# Patient Record
Sex: Male | Born: 1955 | Race: White | Hispanic: No | Marital: Married | Smoking: Former smoker
Health system: Southern US, Community
[De-identification: ages and names within clinical notes are randomized; demographics above are authoritative.]

## PROBLEM LIST (undated history)

## (undated) DIAGNOSIS — E785 Hyperlipidemia, unspecified: Secondary | ICD-10-CM

## (undated) DIAGNOSIS — F419 Anxiety disorder, unspecified: Secondary | ICD-10-CM

## (undated) DIAGNOSIS — R002 Palpitations: Secondary | ICD-10-CM

## (undated) DIAGNOSIS — Z87891 Personal history of nicotine dependence: Secondary | ICD-10-CM

## (undated) DIAGNOSIS — E039 Hypothyroidism, unspecified: Secondary | ICD-10-CM

## (undated) DIAGNOSIS — I10 Essential (primary) hypertension: Secondary | ICD-10-CM

## (undated) DIAGNOSIS — J439 Emphysema, unspecified: Secondary | ICD-10-CM

## (undated) DIAGNOSIS — I701 Atherosclerosis of renal artery: Secondary | ICD-10-CM

## (undated) DIAGNOSIS — M1990 Unspecified osteoarthritis, unspecified site: Secondary | ICD-10-CM

## (undated) DIAGNOSIS — E079 Disorder of thyroid, unspecified: Secondary | ICD-10-CM

## (undated) DIAGNOSIS — H269 Unspecified cataract: Secondary | ICD-10-CM

## (undated) HISTORY — DX: Palpitations: R00.2

## (undated) HISTORY — DX: Hypothyroidism, unspecified: E03.9

## (undated) HISTORY — DX: Essential (primary) hypertension: I10

## (undated) HISTORY — DX: Emphysema, unspecified: J43.9

## (undated) HISTORY — DX: Hyperlipidemia, unspecified: E78.5

## (undated) HISTORY — PX: EYE SURGERY: SHX253

## (undated) HISTORY — DX: Unspecified cataract: H26.9

## (undated) HISTORY — DX: Atherosclerosis of renal artery: I70.1

## (undated) HISTORY — DX: Unspecified osteoarthritis, unspecified site: M19.90

## (undated) HISTORY — DX: Personal history of nicotine dependence: Z87.891

---

## 1998-08-14 ENCOUNTER — Emergency Department (HOSPITAL_COMMUNITY): Admission: EM | Admit: 1998-08-14 | Discharge: 1998-08-14 | Payer: Self-pay | Admitting: Emergency Medicine

## 2003-05-28 ENCOUNTER — Emergency Department (HOSPITAL_COMMUNITY): Admission: EM | Admit: 2003-05-28 | Discharge: 2003-05-28 | Payer: Self-pay | Admitting: Emergency Medicine

## 2003-11-17 ENCOUNTER — Ambulatory Visit (HOSPITAL_COMMUNITY): Admission: RE | Admit: 2003-11-17 | Discharge: 2003-11-17 | Payer: Self-pay | Admitting: Family Medicine

## 2007-12-30 ENCOUNTER — Ambulatory Visit (HOSPITAL_COMMUNITY): Admission: RE | Admit: 2007-12-30 | Discharge: 2007-12-30 | Payer: Self-pay | Admitting: Family Medicine

## 2010-11-21 ENCOUNTER — Ambulatory Visit (HOSPITAL_COMMUNITY)
Admission: RE | Admit: 2010-11-21 | Discharge: 2010-11-21 | Disposition: A | Discharge status: Home / Self Care | Payer: PRIVATE HEALTH INSURANCE | Source: Ambulatory Visit | Attending: Family Medicine | Admitting: Family Medicine

## 2010-11-21 ENCOUNTER — Other Ambulatory Visit (HOSPITAL_COMMUNITY): Payer: Self-pay | Admitting: Family Medicine

## 2010-11-21 ENCOUNTER — Encounter (HOSPITAL_COMMUNITY): Payer: Self-pay

## 2010-11-21 DIAGNOSIS — F17200 Nicotine dependence, unspecified, uncomplicated: Secondary | ICD-10-CM

## 2010-11-21 DIAGNOSIS — R1010 Upper abdominal pain, unspecified: Secondary | ICD-10-CM

## 2010-11-21 DIAGNOSIS — R109 Unspecified abdominal pain: Secondary | ICD-10-CM | POA: Insufficient documentation

## 2010-11-21 DIAGNOSIS — R0789 Other chest pain: Secondary | ICD-10-CM

## 2010-11-21 DIAGNOSIS — R05 Cough: Secondary | ICD-10-CM | POA: Insufficient documentation

## 2010-11-21 DIAGNOSIS — R059 Cough, unspecified: Secondary | ICD-10-CM | POA: Insufficient documentation

## 2011-01-09 ENCOUNTER — Ambulatory Visit (INDEPENDENT_AMBULATORY_CARE_PROVIDER_SITE_OTHER): Payer: PRIVATE HEALTH INSURANCE | Admitting: Internal Medicine

## 2011-01-15 ENCOUNTER — Ambulatory Visit (INDEPENDENT_AMBULATORY_CARE_PROVIDER_SITE_OTHER): Payer: PRIVATE HEALTH INSURANCE | Admitting: Internal Medicine

## 2011-01-15 DIAGNOSIS — K921 Melena: Secondary | ICD-10-CM

## 2011-01-30 NOTE — Consult Note (Addendum)
[name], [name] NO.:  [id number]  MEDICAL RECORD NO.:  [id number]           PATIENT TYPE:  LOCATION:                                 FACILITY:  PHYSICIAN:  Lionel December, M.D.    DATE OF BIRTH:  07-13-1956  DATE OF CONSULTATION: DATE OF DISCHARGE:                                CONSULTATION   PRESENTING COMPLAINT:  Recent bout with diarrhea and hematochezia.  HISTORY OF PRESENT ILLNESS:  Daniel [name] is a 55 year old Caucasian male who is referred through courtesy of Dr. Phillips Odor, for GI evaluation.  He was in usual state of health until about 8 weeks ago, he developed bout with diarrhea and hematochezia.  He was treated with antibiotics with resolution of his diarrhea and he has not experienced any more bleeding. He saw Dr. Phillips Odor.  The patient's last colonoscopy has been over 10 years ago and he felt he should get this exam done to make sure he does not have polyps or neoplasm.  He is having normal stools now.  He says his appetite is very good.  He has not lost any weight recently.  He does complain of intermittent pain in his right mid abdomen which he has experienced some mornings when he wakes up.  He denies injury to his back.  His pain is not associated with nausea, vomiting, dysuria, hematuria, or bowel urgency.  REVIEW OF THE SYSTEMS:  Negative for heartburn, dysphagia.  CURRENT MEDICATIONS: 1. Citalopram 10 mg p.o. daily. 2. Dexilant 60 mg p.o. q.a.m. 3. Levothyroxine 125 mcg p.o. daily. 4. Motrin OTC 1-2 daily p.r.n. which is occasional.  PAST MEDICAL HISTORY:  History of stress disorder and he is doing well with therapy.  Hypothyroidism was diagnosed about 8 years ago.  History of GERD. Symptoms well-controlled with therapy, he has been on it for about 6 months or so.  He was diagnosed with COPD about 2 months ago based on chest pain.  He states apparently he had similar findings on chest x-ray 3 years ago, but did not know the result.   This diagnosis has led him to quit cigarette smoking.  His last colonoscopy was over 10 years ago by me and was normal.  FAMILY HISTORY:  Mother is 28 and has diabetes and osteoarthrosis. Father died at age 36 of mesothelioma within 6 weeks of diagnosis.  He has a sister age 5 in good health.  He has a paternal aunt who is in her 20's and has Crohn disease.  SOCIAL HISTORY:  He is married.  He has one son in good health.  He has two adopted children, one stepchild also in good health.  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  He smoked about a pack to pack and half per day for 40 years, but quit 7 weeks ago.  He drinks beer no more than 4-7 drinks per week.  OBJECTIVE:  VITAL SIGNS:  Weight 188 pounds, he is 71 inches tall, pulse 74 per minute, blood pressure 130/78, and temp is 97.8. HEENT:  Conjunctivae is pink.  Sclerae nonicteric.  Oropharyngeal mucosa is normal.  No  neck masses or thyromegaly noted.  CARDIAC:  With regular rhythm.  Normal S1 and S2.  No murmur or gallop noted. LUNGS:  Clear to auscultation. ABDOMEN:  Symmetrical.  Bowel sounds are normal.  On palpation soft abdomen without tenderness, organomegaly, or masses.  RECTAL:  Deferred. EXTREMITIES:  No peripheral edema or clubbing noted.  ASSESSMENT:  Daniel [name] is 55 year old Caucasian male who experienced bout with diarrhea and hematochezia several weeks ago and responded to antibiotic therapy.  He is not having intermittent right mid abdominal pain, first experiences when he wakes up in the morning, intermittent and not associated with other symptoms.  Suspect this may be a musculoskeletal pain.  I agree this colon needs to be survey primarily for screening purposes.  History of GERD.  He appears to be doing well with therapy.  After few months, he may consider taking Dexilant on every other day basis.  RECOMMENDATIONS:  Colonoscopy to be performed at [facility] in near future.  I have reviewed the procedures  with Daniel [name] and he is agreeable.  We appreciate the opportunity to participate in the care of this gentleman.          ______________________________ Lionel December, M.D.     NR/MEDQ  D:  01/15/2011  T:  01/16/2011  Job:  [id number]  cc:   Dr. Phillips Odor  Electronically Signed by Lionel December M.D. on 01/30/2011 10:20:50 AM

## 2011-02-22 ENCOUNTER — Encounter (INDEPENDENT_AMBULATORY_CARE_PROVIDER_SITE_OTHER): Payer: PRIVATE HEALTH INSURANCE | Admitting: Internal Medicine

## 2011-02-22 ENCOUNTER — Ambulatory Visit (HOSPITAL_COMMUNITY)
Admission: RE | Admit: 2011-02-22 | Payer: PRIVATE HEALTH INSURANCE | Source: Ambulatory Visit | Admitting: Internal Medicine

## 2012-04-28 ENCOUNTER — Ambulatory Visit (HOSPITAL_COMMUNITY)
Admission: RE | Admit: 2012-04-28 | Discharge: 2012-04-28 | Disposition: A | Discharge status: Home / Self Care | Payer: PRIVATE HEALTH INSURANCE | Source: Ambulatory Visit | Attending: Family Medicine | Admitting: Family Medicine

## 2012-04-28 ENCOUNTER — Other Ambulatory Visit (HOSPITAL_COMMUNITY): Payer: Self-pay | Admitting: Family Medicine

## 2012-04-28 DIAGNOSIS — M25569 Pain in unspecified knee: Secondary | ICD-10-CM

## 2012-04-28 DIAGNOSIS — IMO0002 Reserved for concepts with insufficient information to code with codable children: Secondary | ICD-10-CM

## 2012-04-28 DIAGNOSIS — M25469 Effusion, unspecified knee: Secondary | ICD-10-CM | POA: Insufficient documentation

## 2012-07-18 HISTORY — PX: US ECHOCARDIOGRAPHY: HXRAD669

## 2012-10-06 ENCOUNTER — Encounter (INDEPENDENT_AMBULATORY_CARE_PROVIDER_SITE_OTHER): Payer: Self-pay | Admitting: *Deleted

## 2012-11-25 ENCOUNTER — Ambulatory Visit (INDEPENDENT_AMBULATORY_CARE_PROVIDER_SITE_OTHER): Payer: BC Managed Care – PPO | Admitting: Urology

## 2012-11-25 DIAGNOSIS — E291 Testicular hypofunction: Secondary | ICD-10-CM

## 2012-11-25 DIAGNOSIS — R972 Elevated prostate specific antigen [PSA]: Secondary | ICD-10-CM

## 2012-11-25 DIAGNOSIS — N529 Male erectile dysfunction, unspecified: Secondary | ICD-10-CM

## 2012-11-27 ENCOUNTER — Other Ambulatory Visit (HOSPITAL_COMMUNITY): Payer: Self-pay | Admitting: Physician Assistant

## 2012-11-27 DIAGNOSIS — M25562 Pain in left knee: Secondary | ICD-10-CM

## 2012-11-27 DIAGNOSIS — M7122 Synovial cyst of popliteal space [Baker], left knee: Secondary | ICD-10-CM

## 2012-11-28 ENCOUNTER — Ambulatory Visit (HOSPITAL_COMMUNITY)
Admission: RE | Admit: 2012-11-28 | Discharge: 2012-11-28 | Disposition: A | Discharge status: Home / Self Care | Payer: BC Managed Care – PPO | Source: Ambulatory Visit | Attending: Physician Assistant | Admitting: Physician Assistant

## 2012-11-28 DIAGNOSIS — M7122 Synovial cyst of popliteal space [Baker], left knee: Secondary | ICD-10-CM

## 2012-11-28 DIAGNOSIS — M79609 Pain in unspecified limb: Secondary | ICD-10-CM | POA: Insufficient documentation

## 2012-11-28 DIAGNOSIS — M7120 Synovial cyst of popliteal space [Baker], unspecified knee: Secondary | ICD-10-CM | POA: Insufficient documentation

## 2012-11-28 DIAGNOSIS — I82890 Acute embolism and thrombosis of other specified veins: Secondary | ICD-10-CM | POA: Insufficient documentation

## 2012-11-28 DIAGNOSIS — M25562 Pain in left knee: Secondary | ICD-10-CM

## 2013-01-21 ENCOUNTER — Ambulatory Visit: Payer: Self-pay | Admitting: General Practice

## 2013-03-27 ENCOUNTER — Ambulatory Visit (INDEPENDENT_AMBULATORY_CARE_PROVIDER_SITE_OTHER): Payer: BC Managed Care – PPO | Admitting: Urology

## 2013-03-27 DIAGNOSIS — N529 Male erectile dysfunction, unspecified: Secondary | ICD-10-CM

## 2013-03-27 DIAGNOSIS — E291 Testicular hypofunction: Secondary | ICD-10-CM

## 2013-05-29 ENCOUNTER — Ambulatory Visit: Payer: Self-pay | Admitting: General Practice

## 2013-05-29 HISTORY — PX: MENISCUS REPAIR: SHX5179

## 2013-07-07 ENCOUNTER — Other Ambulatory Visit: Payer: Self-pay | Admitting: *Deleted

## 2013-07-07 MED ORDER — NEBIVOLOL HCL 5 MG PO TABS: 2.5000 mg | ORAL_TABLET | Freq: Every day | ORAL | Status: DC

## 2013-07-07 NOTE — Telephone Encounter (Signed)
Rx was sent to pharmacy electronically. 

## 2013-07-17 ENCOUNTER — Ambulatory Visit (INDEPENDENT_AMBULATORY_CARE_PROVIDER_SITE_OTHER): Payer: BC Managed Care – PPO | Admitting: Cardiovascular Disease

## 2013-07-17 ENCOUNTER — Encounter: Payer: Self-pay | Admitting: Cardiovascular Disease

## 2013-07-17 VITALS — BP 130/80 | HR 65 | Ht 71.0 in | Wt 198.0 lb

## 2013-07-17 DIAGNOSIS — Z79899 Other long term (current) drug therapy: Secondary | ICD-10-CM

## 2013-07-17 DIAGNOSIS — I10 Essential (primary) hypertension: Secondary | ICD-10-CM | POA: Insufficient documentation

## 2013-07-17 DIAGNOSIS — E785 Hyperlipidemia, unspecified: Secondary | ICD-10-CM | POA: Insufficient documentation

## 2013-07-17 NOTE — Assessment & Plan Note (Signed)
Intolerant to statin therapy. We will recheck a lipid and liver profile

## 2013-07-17 NOTE — Progress Notes (Signed)
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 He was previously a patient of Dr. Susa Griffins. I am assuming his care. I apparently took care of his father prior to his death. His cardiac risk factor profile is positive for 80 pack years of tobacco abuse having quit 25 years ago, treated hypertension and mild hyperlipidemia. He has never had a heart for stroke. He denies chest pain or shortness of breath. He had a negative Myoview stress test 07/18/12 a normal 2-D echo at that time.   Current Outpatient Prescriptions  Medication Sig Dispense Refill  . aspirin 81 MG tablet Take 81 mg by mouth daily.      Marland Kitchen escitalopram (LEXAPRO) 10 MG tablet Take 10 mg by mouth at bedtime.       Marland Kitchen levothyroxine (SYNTHROID, LEVOTHROID) 125 MCG tablet Take 125 mcg by mouth daily before breakfast.       . losartan-hydrochlorothiazide (HYZAAR) 50-12.5 MG per tablet Take by mouth. Takes 1/2 tablet daily      . nebivolol (BYSTOLIC) 5 MG tablet Take 0.5 tablets (2.5 mg total) by mouth daily.  15 tablet  8  . tadalafil (CIALIS) 10 MG tablet Take 10 mg by mouth as needed for erectile dysfunction.       No current facility-administered medications for this visit.    No Known Allergies  History   Social History  . Marital Status: Married    Spouse Name: N/A    Number of Children: N/A  . Years of Education: N/A   Occupational History  . Not on file.   Social History Main Topics  . Smoking status: Former Smoker -- 2.00 packs/day for 40 years    Types: Cigarettes    Quit date: 12/16/2010  . Smokeless tobacco: Not on file  . Alcohol Use: 14.0 oz/week    28 drink(s) per week     Comment: Beer  . Drug Use: No  . Sexual Activity:  Not on file   Other Topics Concern  . Not on file   Social History Narrative  . No narrative on file     Review of Systems: General: negative for chills, fever, night sweats or weight changes.  Cardiovascular: negative for chest pain, dyspnea on exertion, edema, orthopnea, palpitations, paroxysmal nocturnal dyspnea or shortness of breath Dermatological: negative for rash Respiratory: negative for cough or wheezing Urologic: negative for hematuria Abdominal: negative for nausea, vomiting, diarrhea, bright red blood per rectum, melena, or hematemesis Neurologic: negative for visual changes, syncope, or dizziness All other systems reviewed and are otherwise negative except as noted above.    Blood pressure 130/80, pulse 65, height 5\' 11"  (1.803 m), weight 198 lb (89.812 kg).  General appearance: alert and no distress Neck: no adenopathy, no carotid bruit, no JVD, supple, symmetrical, trachea midline and thyroid not enlarged, symmetric, no tenderness/mass/nodules Lungs: clear to auscultation bilaterally Heart: regular rate and rhythm, S1, S2 normal, no murmur, click, rub or gallop Abdomen: soft, non-tender; bowel sounds normal; no masses,  no organomegaly Extremities: extremities normal, atraumatic, no cyanosis or edema and 2+ pedal pulses  EKG sinus rhythm at 65 without ST or T wave changes  ASSESSMENT AND PLAN:   Hypertension Well-controlled on current medications  Hyperlipidemia Intolerant to  statin therapy. We will recheck a lipid and liver profile      Runell Gess MD [facility] 07/17/2013 12:53 PM

## 2013-07-17 NOTE — Patient Instructions (Signed)
Dr Allyson Sabal has ordered blood work to be done fasting.    Dr Allyson Sabal will see you back in 1 year.

## 2013-07-17 NOTE — Assessment & Plan Note (Signed)
Well-controlled on current medications 

## 2013-10-15 ENCOUNTER — Telehealth (HOSPITAL_COMMUNITY): Payer: Self-pay | Admitting: *Deleted

## 2013-10-20 ENCOUNTER — Other Ambulatory Visit (HOSPITAL_COMMUNITY): Payer: Self-pay | Admitting: Cardiovascular Disease

## 2013-10-20 DIAGNOSIS — I701 Atherosclerosis of renal artery: Secondary | ICD-10-CM

## 2013-10-29 ENCOUNTER — Ambulatory Visit (HOSPITAL_COMMUNITY)
Admission: RE | Admit: 2013-10-29 | Discharge: 2013-10-29 | Disposition: A | Discharge status: Home / Self Care | Payer: BC Managed Care – PPO | Source: Ambulatory Visit | Attending: Internal Medicine | Admitting: Internal Medicine

## 2013-10-29 DIAGNOSIS — I701 Atherosclerosis of renal artery: Secondary | ICD-10-CM

## 2013-10-29 NOTE — Progress Notes (Signed)
Renal Artery Duplex Completed. °Brianna L Mazza,RVT °

## 2013-11-15 ENCOUNTER — Encounter: Payer: Self-pay | Admitting: *Deleted

## 2013-11-15 ENCOUNTER — Telehealth: Payer: Self-pay | Admitting: *Deleted

## 2013-11-15 DIAGNOSIS — I701 Atherosclerosis of renal artery: Secondary | ICD-10-CM

## 2013-11-15 NOTE — Telephone Encounter (Signed)
Order placed for repeat renal dopplers in 6 months

## 2013-11-15 NOTE — Telephone Encounter (Signed)
Message copied by Chauncy Lean on Sun Nov 15, 2013 10:32 PM ------      Message from: Lorretta Harp      Created: Sat Nov 14, 2013 11:16 AM       No change from prior study. Repeat in 6 months ------

## 2014-02-05 ENCOUNTER — Other Ambulatory Visit: Payer: Self-pay

## 2014-02-05 MED ORDER — LOSARTAN POTASSIUM-HCTZ 50-12.5 MG PO TABS: 0.5000 | ORAL_TABLET | Freq: Every day | ORAL | Status: DC

## 2014-02-05 NOTE — Telephone Encounter (Signed)
Rx was sent to pharmacy electronically. 

## 2014-02-11 ENCOUNTER — Telehealth: Payer: Self-pay | Admitting: Cardiovascular Disease

## 2014-02-11 NOTE — Telephone Encounter (Signed)
RN returned call to pharmacy to clarify refill for losartan-hctz. Per last OV noted in October 2014, patient was taking 1/2 tablet QD. Pharmacy will confirm with patient when they pick up script.

## 2014-02-11 NOTE — Telephone Encounter (Signed)
Daniel [name] with [facility] has a question about an Escript that he received for this patient for Losartin.

## 2014-04-19 ENCOUNTER — Other Ambulatory Visit (HOSPITAL_COMMUNITY): Payer: Self-pay | Admitting: Cardiovascular Disease

## 2014-04-19 NOTE — Telephone Encounter (Signed)
Rx was sent to pharmacy electronically. 

## 2014-06-03 ENCOUNTER — Ambulatory Visit (HOSPITAL_COMMUNITY)
Admission: RE | Admit: 2014-06-03 | Discharge: 2014-06-03 | Disposition: A | Discharge status: Home / Self Care | Payer: BC Managed Care – PPO | Source: Ambulatory Visit | Attending: Internal Medicine | Admitting: Internal Medicine

## 2014-06-03 DIAGNOSIS — I701 Atherosclerosis of renal artery: Secondary | ICD-10-CM | POA: Diagnosis not present

## 2014-06-03 NOTE — Progress Notes (Signed)
Renal Artery Duplex Completed. °Brianna L Mazza,RVT °

## 2014-06-15 ENCOUNTER — Encounter (INDEPENDENT_AMBULATORY_CARE_PROVIDER_SITE_OTHER): Payer: Self-pay | Admitting: *Deleted

## 2014-06-16 NOTE — Progress Notes (Signed)
I spoke with Dr Gwenlyn Found. We will repeat the test in 6 months

## 2014-07-21 ENCOUNTER — Other Ambulatory Visit (HOSPITAL_COMMUNITY): Payer: Self-pay | Admitting: Cardiovascular Disease

## 2014-07-21 NOTE — Telephone Encounter (Signed)
Rx was sent to pharmacy electronically. 

## 2014-08-10 ENCOUNTER — Other Ambulatory Visit (HOSPITAL_COMMUNITY): Payer: Self-pay | Admitting: Cardiovascular Disease

## 2014-08-10 NOTE — Telephone Encounter (Signed)
Rx was sent to pharmacy electronically. 

## 2014-08-24 ENCOUNTER — Other Ambulatory Visit (HOSPITAL_COMMUNITY): Payer: Self-pay | Admitting: Cardiovascular Disease

## 2014-08-24 NOTE — Telephone Encounter (Signed)
Rx was sent to pharmacy electronically. 

## 2014-09-13 ENCOUNTER — Other Ambulatory Visit (HOSPITAL_COMMUNITY): Payer: Self-pay | Admitting: Cardiovascular Disease

## 2014-09-13 NOTE — Telephone Encounter (Signed)
Rx(s) sent to pharmacy electronically. OV 10/26/14

## 2014-09-23 ENCOUNTER — Other Ambulatory Visit (HOSPITAL_COMMUNITY): Payer: Self-pay | Admitting: Cardiovascular Disease

## 2014-09-23 NOTE — Telephone Encounter (Signed)
Rx(s) sent to pharmacy electronically. OV 10/26/14

## 2014-09-28 ENCOUNTER — Telehealth: Payer: Self-pay | Admitting: Cardiovascular Disease

## 2014-09-28 NOTE — Telephone Encounter (Signed)
Need prior authorization for his Bystolic 5 mg please.

## 2014-09-28 NOTE — Telephone Encounter (Signed)
Pharmacy sent PA via covermymeds PA is thru [facility] # [phone] Patient last OV 06/2013 Needs Prior Auth for [place]  Routed to [place], Therapist, sports

## 2014-09-29 NOTE — Telephone Encounter (Signed)
Please complete PA

## 2014-10-01 NOTE — Telephone Encounter (Signed)
PA completed.  Awaiting results

## 2014-10-07 NOTE — Telephone Encounter (Signed)
Pt hasn't been seen in > 1 year.  Would suggest d/c Bystolic (only on 2.5 mg daily anyway) and bring patient back for MD appt.  Can then increase losartan/hct if his BP is elevated.  Was WNL at 2014 visit with Dr. Gwenlyn Found.

## 2014-10-07 NOTE — Telephone Encounter (Signed)
The PA was denied.  He has never tried any other beta blockers.  He was put on bystolic on 22/48/2500 by Dr Rollene Fare for hypertension.  The insurance will cover atenolol, carvedilol, and metoprolol.  Can we switch to another med?

## 2014-10-12 ENCOUNTER — Ambulatory Visit (INDEPENDENT_AMBULATORY_CARE_PROVIDER_SITE_OTHER): Payer: BLUE CROSS/BLUE SHIELD | Admitting: Urology

## 2014-10-12 DIAGNOSIS — E291 Testicular hypofunction: Secondary | ICD-10-CM

## 2014-10-12 DIAGNOSIS — R972 Elevated prostate specific antigen [PSA]: Secondary | ICD-10-CM

## 2014-10-12 DIAGNOSIS — N5201 Erectile dysfunction due to arterial insufficiency: Secondary | ICD-10-CM

## 2014-10-14 ENCOUNTER — Telehealth: Payer: Self-pay | Admitting: Cardiovascular Disease

## 2014-10-14 NOTE — Telephone Encounter (Signed)
Received records from [facility] (Dr Franchot Gallo) for appointment on 10/26/14 with Dr Gwenlyn Found.  Records given to [facility] (medical records) for Dr Kennon Holter schedule on 10/26/14.  lp

## 2014-10-24 ENCOUNTER — Emergency Department (HOSPITAL_COMMUNITY)
Admission: EM | Admit: 2014-10-24 | Discharge: 2014-10-25 | Disposition: A | Discharge status: Home / Self Care | Payer: BLUE CROSS/BLUE SHIELD | Attending: Emergency Medicine | Admitting: Emergency Medicine

## 2014-10-24 ENCOUNTER — Encounter (HOSPITAL_COMMUNITY): Payer: Self-pay

## 2014-10-24 DIAGNOSIS — R002 Palpitations: Secondary | ICD-10-CM | POA: Diagnosis present

## 2014-10-24 DIAGNOSIS — I10 Essential (primary) hypertension: Secondary | ICD-10-CM | POA: Insufficient documentation

## 2014-10-24 DIAGNOSIS — E785 Hyperlipidemia, unspecified: Secondary | ICD-10-CM | POA: Diagnosis not present

## 2014-10-24 DIAGNOSIS — I493 Ventricular premature depolarization: Secondary | ICD-10-CM | POA: Insufficient documentation

## 2014-10-24 DIAGNOSIS — Z87891 Personal history of nicotine dependence: Secondary | ICD-10-CM | POA: Insufficient documentation

## 2014-10-24 DIAGNOSIS — E079 Disorder of thyroid, unspecified: Secondary | ICD-10-CM | POA: Insufficient documentation

## 2014-10-24 DIAGNOSIS — F419 Anxiety disorder, unspecified: Secondary | ICD-10-CM | POA: Diagnosis not present

## 2014-10-24 HISTORY — DX: Anxiety disorder, unspecified: F41.9

## 2014-10-24 HISTORY — DX: Disorder of thyroid, unspecified: E07.9

## 2014-10-24 LAB — CBC WITH DIFFERENTIAL/PLATELET
Basophils Absolute: 0 10*3/uL (ref 0.0–0.1)
Basophils Relative: 0 % (ref 0–1)
Eosinophils Absolute: 0 10*3/uL (ref 0.0–0.7)
Eosinophils Relative: 0 % (ref 0–5)
HCT: 36.2 % — ABNORMAL LOW (ref 39.0–52.0)
Hemoglobin: 12.9 g/dL — ABNORMAL LOW (ref 13.0–17.0)
Lymphocytes Relative: 12 % (ref 12–46)
Lymphs Abs: 1.2 10*3/uL (ref 0.7–4.0)
MCH: 32.3 pg (ref 26.0–34.0)
MCHC: 35.6 g/dL (ref 30.0–36.0)
MCV: 90.5 fL (ref 78.0–100.0)
Monocytes Absolute: 0.9 10*3/uL (ref 0.1–1.0)
Monocytes Relative: 9 % (ref 3–12)
Neutro Abs: 7.9 10*3/uL — ABNORMAL HIGH (ref 1.7–7.7)
Neutrophils Relative %: 79 % — ABNORMAL HIGH (ref 43–77)
Platelets: 195 10*3/uL (ref 150–400)
RBC: 4 MIL/uL — ABNORMAL LOW (ref 4.22–5.81)
RDW: 12.2 % (ref 11.5–15.5)
WBC: 10 10*3/uL (ref 4.0–10.5)

## 2014-10-24 NOTE — ED Notes (Signed)
Patient states he began to have palpitations about a month ago. Patient states the palpitations come and go.

## 2014-10-25 LAB — BASIC METABOLIC PANEL
Anion gap: 7 (ref 5–15)
BUN: 15 mg/dL (ref 6–23)
CO2: 27 mmol/L (ref 19–32)
Calcium: 9 mg/dL (ref 8.4–10.5)
Chloride: 104 mmol/L (ref 96–112)
Creatinine, Ser: 0.9 mg/dL (ref 0.50–1.35)
GFR calc Af Amer: >90 mL/min (ref >90)
GFR calc non Af Amer: >90 mL/min (ref >90)
Glucose, Bld: 106 mg/dL — ABNORMAL HIGH (ref 70–99)
Potassium: 3.5 mmol/L (ref 3.5–5.1)
Sodium: 138 mmol/L (ref 135–145)

## 2014-10-25 LAB — TROPONIN I: Troponin I: <0.03 ng/mL (ref <0.031)

## 2014-10-25 LAB — MAGNESIUM: Magnesium: 1.9 mg/dL (ref 1.5–2.5)

## 2014-10-25 MED ORDER — AZITHROMYCIN 250 MG PO TABS: 250.0000 mg | ORAL_TABLET | Freq: Every day | ORAL | Status: DC

## 2014-10-25 NOTE — Discharge Instructions (Signed)
Palpitations A palpitation is the feeling that your heartbeat is irregular or is faster than normal. It may feel like your heart is fluttering or skipping a beat. Palpitations are usually not a serious problem. However, in some cases, you may need further medical evaluation. CAUSES  Palpitations can be caused by:  Smoking.  Caffeine or other stimulants, such as diet pills or energy drinks.  Alcohol.  Stress and anxiety.  Strenuous physical activity.  Fatigue.  Certain medicines.  Heart disease, especially if you have a history of irregular heart rhythms (arrhythmias), such as atrial fibrillation, atrial flutter, or supraventricular tachycardia.  An improperly working pacemaker or defibrillator. DIAGNOSIS  To find the cause of your palpitations, your health care provider will take your medical history and perform a physical exam. Your health care provider may also have you take a test called an ambulatory electrocardiogram (ECG). An ECG records your heartbeat patterns over a 24-hour period. You may also have other tests, such as:  Transthoracic echocardiogram (TTE). During echocardiography, sound waves are used to evaluate how blood flows through your heart.  Transesophageal echocardiogram (TEE).  Cardiac monitoring. This allows your health care provider to monitor your heart rate and rhythm in real time.  Holter monitor. This is a portable device that records your heartbeat and can help diagnose heart arrhythmias. It allows your health care provider to track your heart activity for several days, if needed.  Stress tests by exercise or by giving medicine that makes the heart beat faster. TREATMENT  Treatment of palpitations depends on the cause of your symptoms and can vary greatly. Most cases of palpitations do not require any treatment other than time, relaxation, and monitoring your symptoms. Other causes, such as atrial fibrillation, atrial flutter, or supraventricular  tachycardia, usually require further treatment. HOME CARE INSTRUCTIONS   Avoid:  Caffeinated coffee, tea, soft drinks, diet pills, and energy drinks.  Chocolate.  Alcohol.  Stop smoking if you smoke.  Reduce your stress and anxiety. Things that can help you relax include:  A method of controlling things in your body, such as your heartbeats, with your mind (biofeedback).  Yoga.  Meditation.  Physical activity such as swimming, jogging, or walking.  Get plenty of rest and sleep. SEEK MEDICAL CARE IF:   You continue to have a fast or irregular heartbeat beyond 24 hours.  Your palpitations occur more often. SEEK IMMEDIATE MEDICAL CARE IF:  You have chest pain or shortness of breath.  You have a severe headache.  You feel dizzy or you faint. MAKE SURE YOU:  Understand these instructions.  Will watch your condition.  Will get help right away if you are not doing well or get worse. Document Released: 08/31/2000 Document Revised: 09/08/2013 Document Reviewed: 11/02/2011 [facility] Patient Information 2015 [place], [place]. This information is not intended to replace advice given to you by your health care provider. Make sure you discuss any questions you have with your health care provider.  Premature Beats A premature beat is an extra heartbeat that happens earlier than normal. Premature beats are called premature atrial contractions (PACs) or premature ventricular contractions (PVCs) depending on the area of the heart where they start. CAUSES  Premature beats may be brought on by a variety of factors including:  Emotional stress.  Lack of sleep.  Caffeine.  Asthma medicines.  Stimulants.  Herbal teas.  Dietary supplements.  Alcohol. In most cases, premature beats are not dangerous and are not a sign of serious heart disease. Most patients evaluated  for premature beats have completely normal heart function. Rarely, premature beats may be a sign of more  significant heart problems or medical illness. SYMPTOMS  Premature beats may cause palpitations. This means you feel like your heart is skipping a beat or beating harder than usual. Sometimes, slight chest pain occurs with premature beats, lasting only a few seconds. This pain has been described as a "flopping" feeling inside the chest. In many cases, premature beats do not cause any symptoms and they are only detected when an electrocardiography test (EKG) or heart monitoring is performed. DIAGNOSIS  Your caregiver may run some tests to evaluate your heart such as an EKG or echocardiography. You may need to wear a portable heart monitor for several days to record the electrical activity of your heart. Blood testing may also be performed to check your electrolytes and thyroid function. TREATMENT  Premature beats usually go away with rest. If the problem continues, your caregiver will determine a treatment plan for you.  HOME CARE INSTRUCTIONS  Get plenty of rest over the next few days until your symptoms improve.  Avoid coffee, tea, alcohol, and soda (pop, cola).  Do not smoke. SEEK MEDICAL CARE IF:  Your symptoms continue after 1 to 2 days of rest.  You have new symptoms, such as chest pain or trouble breathing. SEEK IMMEDIATE MEDICAL CARE IF:  You have severe chest pain or abdominal pain.  You have pain that radiates into the neck, arm, or jaw.  You faint or have extreme weakness.  You have shortness of breath.  Your heartbeat races for more than 5 seconds. MAKE SURE YOU:  Understand these instructions.  Will watch your condition.  Will get help right away if you are not doing well or get worse. Document Released: 10/11/2004 Document Revised: 11/26/2011 Document Reviewed: 05/07/2011 [facility] Patient Information 2015 [place], [place]. This information is not intended to replace advice given to you by your health care provider. Make sure you discuss any questions you have with  your health care provider.

## 2014-10-25 NOTE — ED Notes (Signed)
Pt alert & oriented x4, stable gait. Patient given discharge instructions, paperwork & prescription(s). Patient  instructed to stop at the registration desk to finish any additional paperwork. Patient verbalized understanding. Pt left department w/ no further questions. 

## 2014-10-25 NOTE — ED Provider Notes (Signed)
CSN: [id number]     Arrival date & time 10/24/14  2307 History   First MD Initiated Contact with Patient 10/24/14 2313     Chief Complaint  Patient presents with  . Palpitations     (Consider location/radiation/quality/duration/timing/severity/associated sxs/prior Treatment) HPI   58yM with palpitations. Intermittent for weeks but worse today which is why came to ED. Occasional feels like heart will skip and beat or have very brief fluttering sensation. No appreciable exacerbating or relieving factors. Associated with brief choking sensation that comes and goes within seconds. No sob. No dizziness, lightheadedness. No recent med changes. Drinks some coffee but says actually decreased as compared to what he historically drank. Former smoker.   Past Medical History  Diagnosis Date  . Hypertension   . History of tobacco abuse   . Hyperlipidemia   . Thyroid disease   . Anxiety    Past Surgical History  Procedure Laterality Date  . Meniscus repair Left 05/29/13   History reviewed. No pertinent family history. History  Substance Use Topics  . Smoking status: Former Smoker -- 2.00 packs/day for 40 years    Types: Cigarettes    Quit date: 12/16/2010  . Smokeless tobacco: Current User    Types: Snuff  . Alcohol Use: 14.0 oz/week    28 Not specified per week     Comment: daily    Review of Systems  All systems reviewed and negative, other than as noted in HPI.   Allergies  Review of patient's allergies indicates no known allergies.  Home Medications   Prior to Admission medications   Medication Sig Start Date End Date Taking? Authorizing Provider  aspirin 81 MG tablet Take 81 mg by mouth daily.   Yes Historical Provider, MD  BYSTOLIC 5 MG tablet TAKE 1/2 TABLET BY MOUTH DAILY. 09/23/14  Yes Lorretta Harp, MD  escitalopram (LEXAPRO) 10 MG tablet Take 10 mg by mouth at bedtime.  06/30/13  Yes Historical Provider, MD  levothyroxine (SYNTHROID, LEVOTHROID) 125 MCG tablet Take  125 mcg by mouth daily before breakfast.  06/30/13  Yes Historical Provider, MD  losartan-hydrochlorothiazide (HYZAAR) 50-12.5 MG per tablet TAKE ONE-HALF TABLET BY MOUTH ONCE A DAY 09/13/14  Yes Lorretta Harp, MD  tadalafil (CIALIS) 10 MG tablet Take 10 mg by mouth as needed for erectile dysfunction.   Yes Historical Provider, MD   BP 119/78 mmHg  Pulse 78  Temp(Src) 99.5 F (37.5 C) (Oral)  Resp 20  Ht 5\' 11"  (1.803 m)  Wt 190 lb (86.183 kg)  BMI 26.51 kg/m2  SpO2 97% Physical Exam  Constitutional: He appears well-developed and well-nourished. No distress.  HENT:  Head: Normocephalic and atraumatic.  Eyes: Conjunctivae are normal. Right eye exhibits no discharge. Left eye exhibits no discharge.  Neck: Neck supple.  Cardiovascular: Normal rate, regular rhythm and normal heart sounds.  Exam reveals no gallop and no friction rub.   No murmur heard. Mostly regular with occasional ectopy  Pulmonary/Chest: Effort normal and breath sounds normal. No respiratory distress.  Abdominal: Soft. He exhibits no distension. There is no tenderness.  Musculoskeletal: He exhibits no edema or tenderness.  Neurological: He is alert.  Skin: Skin is warm and dry.  Psychiatric: He has a normal mood and affect. His behavior is normal. Thought content normal.  Nursing note and vitals reviewed.   ED Course  Procedures (including critical care time) Labs Review Labs Reviewed  BASIC METABOLIC PANEL - Abnormal; Notable for the following:    Glucose,  Bld 106 (*)    All other components within normal limits  CBC WITH DIFFERENTIAL/PLATELET - Abnormal; Notable for the following:    RBC 4.00 (*)    Hemoglobin 12.9 (*)    HCT 36.2 (*)    Neutrophils Relative % 79 (*)    Neutro Abs 7.9 (*)    All other components within normal limits  MAGNESIUM  TROPONIN I    Imaging Review No results found.   EKG Interpretation   Date/Time:  Sunday October 24 2014 23:20:27 EST Ventricular Rate:  85 PR  Interval:  141 QRS Duration: 93 QT Interval:  382 QTC Calculation: 454 R Axis:   68 Text Interpretation:  Sinus rhythm Ventricular trigeminy wander Confirmed  by Dorrian Doggett  MD, Ella Golomb ([id number]) on 10/25/2014 12:17:18 AM      MDM   Final diagnoses:  Heart palpitations  PVC's (premature ventricular contractions)    58yM with palpitations. Somewhat frequent PVCs sometimes in pattern of trigeminy. Symptoms corresponding to them on monitor when I was in room. No other concerning symptoms (CP, near syncopal symptoms, etc). Occasional soda or coffee but otherwise no significant stimulant use. No recent med changes. Hx of hypothyroid on supplementation. May be some benefit in obtaining thyroid studies. Lytes normal. Reports upcoming appointment with his cardiologist on Tuesday and will defer further testing or possible meds for suppression to him.     Virgel Manifold, MD 10/28/14 [phone]

## 2014-10-26 ENCOUNTER — Encounter: Payer: Self-pay | Admitting: Cardiovascular Disease

## 2014-10-26 ENCOUNTER — Ambulatory Visit (INDEPENDENT_AMBULATORY_CARE_PROVIDER_SITE_OTHER): Payer: BLUE CROSS/BLUE SHIELD | Admitting: Cardiovascular Disease

## 2014-10-26 VITALS — BP 120/64 | HR 79 | Ht 71.0 in | Wt 194.0 lb

## 2014-10-26 DIAGNOSIS — R002 Palpitations: Secondary | ICD-10-CM

## 2014-10-26 DIAGNOSIS — I10 Essential (primary) hypertension: Secondary | ICD-10-CM

## 2014-10-26 DIAGNOSIS — E785 Hyperlipidemia, unspecified: Secondary | ICD-10-CM

## 2014-10-26 NOTE — Assessment & Plan Note (Signed)
History of hyperlipidemia. We will recheck a lipid liver profile

## 2014-10-26 NOTE — Assessment & Plan Note (Signed)
History of hypertension the blood pressure measurements at 120/64. He hasn't diastolic, losartan and hydrochlorothiazide. Continue current meds at current dosing

## 2014-10-26 NOTE — Progress Notes (Signed)
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 I last saw him one year ago. He is accompanied by his wife today.He was previously a patient of Dr. Terance Ice. I apparently took care of his father prior to his death. His cardiac risk factor profile is positive for 80 pack years of tobacco abuse having quit 25 years ago, treated hypertension and mild hyperlipidemia. He has never had a heart for stroke. He denies chest pain or shortness of breath. He had a negative Myoview stress test 07/18/12 a normal 2-D echo at that time. Over the recent weeks she's noticed increasing frequency of palpitations. He was seen at the emergency room at [facility] last night was noted to have PVCs that were trigeminal in nature. Electrolytes were normal.   Current Outpatient Prescriptions  Medication Sig Dispense Refill  . aspirin 81 MG tablet Take 81 mg by mouth daily.    Marland Kitchen azithromycin (ZITHROMAX) 250 MG tablet Take 1 tablet (250 mg total) by mouth daily. Take first 2 tablets together, then 1 every day until finished. 6 tablet 0  . BYSTOLIC 5 MG tablet TAKE 1/2 TABLET BY MOUTH DAILY. 15 tablet 1  . Coenzyme Q10 (COQ10 PO) Take 1 tablet by mouth daily.    Marland Kitchen escitalopram (LEXAPRO) 10 MG tablet Take 10 mg by mouth at bedtime.     Marland Kitchen levothyroxine (SYNTHROID, LEVOTHROID) 125 MCG tablet Take 125 mcg by mouth daily before breakfast.     . losartan-hydrochlorothiazide (HYZAAR) 50-12.5 MG per tablet TAKE ONE-HALF TABLET BY MOUTH ONCE A DAY 15 tablet 2  . tadalafil (CIALIS) 10 MG tablet Take 10 mg by mouth as needed for erectile dysfunction.     No current facility-administered medications for this visit.    No Known Allergies  History    Social History  . Marital Status: Married    Spouse Name: N/A    Number of Children: N/A  . Years of Education: N/A   Occupational History  . Not on file.   Social History Main Topics  . Smoking status: Former Smoker -- 2.00 packs/day for 40 years    Types: Cigarettes    Quit date: 12/16/2010  . Smokeless tobacco: Current User    Types: Snuff  . Alcohol Use: 14.0 oz/week    28 Not specified per week     Comment: daily  . Drug Use: No  . Sexual Activity: Not on file   Other Topics Concern  . Not on file   Social History Narrative     Review of Systems: General: negative for chills, fever, night sweats or weight changes.  Cardiovascular: negative for chest pain, dyspnea on exertion, edema, orthopnea, palpitations, paroxysmal nocturnal dyspnea or shortness of breath Dermatological: negative for rash Respiratory: negative for cough or wheezing Urologic: negative for hematuria Abdominal: negative for nausea, vomiting, diarrhea, bright red blood per rectum, melena, or hematemesis Neurologic: negative for visual changes, syncope, or dizziness All other systems reviewed and are otherwise negative except as noted above.    Blood pressure 120/64, pulse 79, height 5\' 11"  (1.803 m), weight 194 lb (87.998 kg).  General appearance: alert and no distress Neck: no adenopathy, no carotid bruit, no JVD, supple, symmetrical, trachea midline and thyroid not  enlarged, symmetric, no tenderness/mass/nodules Lungs: clear to auscultation bilaterally Heart: regular rate and rhythm, S1, S2 normal, no murmur, click, rub or gallop Extremities: extremities normal, atraumatic, no cyanosis or edema  EKG normal sinus rhythm at 79 without ST or T-wave changes. I personally reviewed this EKG  ASSESSMENT AND PLAN:   Hypertension History of hypertension the blood pressure measurements at 120/64. He hasn't diastolic, losartan and hydrochlorothiazide. Continue current meds at current  dosing   Hyperlipidemia History of hyperlipidemia. We will recheck a lipid liver profile   Palpitations The patient recently noticed increased frequency of palpitations. He was seen in the emergency room at [facility] where he was noted to have PVCs. His I's were normal. He denies chest pain or shortness of breath. We will check thyroid function tests and obtain a 30 day event monitor.       Lorretta Harp MD FACP,FACC,FAHA, [facility] 10/26/2014 2:30 PM

## 2014-10-26 NOTE — Patient Instructions (Signed)
Dr. Gwenlyn Found has ordered for you to have lab work done in the next few days, and you need to be FASTING.  We request that you follow-up in: 6 months with an extender and in 12 months with Dr Gwenlyn Found.  You will receive a reminder letter in the mail two months in advance. If you don't receive a letter, please call our office to schedule the follow-up appointment.  Your Doctor has ordered you to wear a heart monitor. You will wear this for 30 days.   TIPS -  REMINDERS 1. The sensor is the lanyard that is worn around your neck every day - this is powered by a battery that needs to be changed every day 2. The monitor is the device that allows you to record symptoms - this will need to be charged daily 3. The sensor & monitor need to be within 100 feet of each other at all times 4. The sensor connects to the electrodes (stickers) - these should be changed every 24-48 hours (you do not have to remove them when you bathe, just make sure they are dry when you connect it back to the sensor 5. If you need more supplies (electrodes, batteries), please call the 1-800 # on the back of the pamphlet and [employer] will mail you more supplies 6. If your skin becomes sensitive, please try the sample pack of sensitive skin electrodes (the white packet in your silver box) and call [employer] to have them mail you more of these type of electrodes 7. When you are finish wearing the monitor, please place all supplies back in the silver box, place the silver box in the pre-packaged UPS bag and drop off at [employer] or call them so they can come pick it up   Cardiac Event Monitoring A cardiac event monitor is a small recording device used to help detect abnormal heart rhythms (arrhythmias). The monitor is used to record heart rhythm when noticeable symptoms such as the following occur:  Fast heartbeats (palpitations), such as heart racing or fluttering.  Dizziness.  Fainting or light-headedness.  Unexplained weakness. The  monitor is wired to two electrodes placed on your chest. Electrodes are flat, sticky disks that attach to your skin. The monitor can be worn for up to 30 days. You will wear the monitor at all times, except when bathing.  HOW TO USE YOUR CARDIAC EVENT MONITOR A technician will prepare your chest for the electrode placement. The technician will show you how to place the electrodes, how to work the monitor, and how to replace the batteries. Take time to practice using the monitor before you leave the office. Make sure you understand how to send the information from the monitor to your health care provider. This requires a telephone with a landline, not a cell phone. You need to:  Wear your monitor at all times, except when you are in water:  Do not get the monitor wet.  Take the monitor off when bathing. Do not swim or use a hot tub with it on.  Keep your skin clean. Do not put body lotion or moisturizer on your chest.  Change the electrodes daily or any time they stop sticking to your skin. You might need to use tape to keep them on.  It is possible that your skin under the electrodes could become irritated. To keep this from happening, try to put the electrodes in slightly different places on your chest. However, they must remain in the area under your  left breast and in the upper right section of your chest.  Make sure the monitor is safely clipped to your clothing or in a location close to your body that your health care provider recommends.  Press the button to record when you feel symptoms of heart trouble, such as dizziness, weakness, light-headedness, palpitations, thumping, shortness of breath, unexplained weakness, or a fluttering or racing heart. The monitor is always on and records what happened slightly before you pressed the button, so do not worry about being too late to get good information.  Keep a diary of your activities, such as walking, doing chores, and taking medicine. It is  especially important to note what you were doing when you pushed the button to record your symptoms. This will help your health care provider determine what might be contributing to your symptoms. The information stored in your monitor will be reviewed by your health care provider alongside your diary entries.  Send the recorded information as recommended by your health care provider. It is important to understand that it will take some time for your health care provider to process the results.  Change the batteries as recommended by your health care provider. SEEK IMMEDIATE MEDICAL CARE IF:   You have chest pain.  You have extreme difficulty breathing or shortness of breath.  You develop a very fast heartbeat that persists.  You develop dizziness that does not go away.  You faint or constantly feel you are about to faint. Document Released: 06/12/2008 Document Revised: 01/18/2014 Document Reviewed: 03/02/2013 [facility] Patient Information 2015 [place], [place]. This information is not intended to replace advice given to you by your health care provider. Make sure you discuss any questions you have with your health care provider.

## 2014-10-26 NOTE — Assessment & Plan Note (Signed)
The patient recently noticed increased frequency of palpitations. He was seen in the emergency room at [facility] where he was noted to have PVCs. His I's were normal. He denies chest pain or shortness of breath. We will check thyroid function tests and obtain a 30 day event monitor.

## 2014-10-27 LAB — HEPATIC FUNCTION PANEL
ALBUMIN: 4.2 g/dL (ref 3.5–5.2)
ALT: 16 U/L (ref 0–53)
AST: 20 U/L (ref 0–37)
Alkaline Phosphatase: 73 U/L (ref 39–117)
Bilirubin, Direct: 0.2 mg/dL (ref 0.0–0.3)
Indirect Bilirubin: 0.7 mg/dL (ref 0.2–1.2)
TOTAL PROTEIN: 7.1 g/dL (ref 6.0–8.3)
Total Bilirubin: 0.9 mg/dL (ref 0.2–1.2)

## 2014-10-27 LAB — LIPID PANEL
CHOLESTEROL: 161 mg/dL (ref 0–200)
HDL: 35 mg/dL — ABNORMAL LOW (ref >39)
LDL Cholesterol: 97 mg/dL (ref 0–99)
Total CHOL/HDL Ratio: 4.6 Ratio
Triglycerides: 146 mg/dL (ref <150)
VLDL: 29 mg/dL (ref 0–40)

## 2014-10-27 LAB — T4, FREE: FREE T4: 1.46 ng/dL (ref 0.80–1.80)

## 2014-11-03 ENCOUNTER — Encounter: Payer: Self-pay | Admitting: *Deleted

## 2014-11-05 ENCOUNTER — Telehealth: Payer: Self-pay | Admitting: Cardiovascular Disease

## 2014-11-05 NOTE — Telephone Encounter (Signed)
Results reviewed w/ patient, advised him that letter was sent on 2/17.  Pt voiced understanding.

## 2014-11-05 NOTE — Telephone Encounter (Signed)
Pt called in stating that she has some blood work done on 2/9 and wanted to see if his results were back . Please f/u with pt  Thanks

## 2014-11-23 ENCOUNTER — Other Ambulatory Visit (HOSPITAL_COMMUNITY): Payer: Self-pay | Admitting: Cardiovascular Disease

## 2014-11-23 NOTE — Telephone Encounter (Signed)
Rx(s) sent to pharmacy electronically.  

## 2014-12-06 ENCOUNTER — Encounter: Payer: Self-pay | Admitting: *Deleted

## 2014-12-16 ENCOUNTER — Ambulatory Visit (HOSPITAL_COMMUNITY)
Admission: RE | Admit: 2014-12-16 | Discharge: 2014-12-16 | Disposition: A | Discharge status: Home / Self Care | Payer: BLUE CROSS/BLUE SHIELD | Source: Ambulatory Visit | Attending: Internal Medicine | Admitting: Internal Medicine

## 2014-12-16 ENCOUNTER — Other Ambulatory Visit (HOSPITAL_COMMUNITY): Payer: Self-pay | Admitting: Cardiovascular Disease

## 2014-12-16 DIAGNOSIS — I701 Atherosclerosis of renal artery: Secondary | ICD-10-CM | POA: Insufficient documentation

## 2014-12-16 NOTE — Progress Notes (Signed)
Renal artery duplex completed.  °Brianna L Mazza,RVT °

## 2014-12-20 ENCOUNTER — Encounter: Payer: Self-pay | Admitting: *Deleted

## 2014-12-24 ENCOUNTER — Other Ambulatory Visit (HOSPITAL_COMMUNITY): Payer: Self-pay | Admitting: Cardiovascular Disease

## 2014-12-24 NOTE — Telephone Encounter (Signed)
Rx(s) sent to pharmacy electronically.  

## 2015-01-07 NOTE — Op Note (Signed)
PATIENT NAME:  Daniel [name], Daniel [name] MR#:  [id number] DATE OF BIRTH:  1956-03-31  DATE OF PROCEDURE:  05/29/2013  PREOPERATIVE DIAGNOSIS: Internal derangement of the left knee.   POSTOPERATIVE DIAGNOSES: 1.  Tear of the posterior horn of the medial meniscus, left knee.  2.  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   PROCEDURE PERFORMED:  Left knee arthroscopy, partial medial meniscectomy, and medial chondroplasty.   SURGEON:  Dr. Skip Estimable   ANESTHESIA:  General.   ESTIMATED BLOOD LOSS:  Minimal.   TOURNIQUET TIME:  Not used.   DRAINS: None.   INDICATIONS FOR SURGERY:  The patient is a 59 year old gentleman who has been seen for complaints of persistent left knee pain. MRI demonstrated findings consistent with meniscal pathology. After discussion of the risks and benefits of surgical intervention, the patient expressed understanding of the risks and benefits, and agreed with plans for surgical intervention.   PROCEDURE IN DETAIL:  The patient was brought to the Operating Room and, after adequate general anesthesia was achieved, a tourniquet was placed on the patient's left thigh, and the leg was placed in a leg holder. All bony prominences were well-padded. The patient's left knee and leg were cleaned and prepped with alcohol and DuraPrep and draped in the usual sterile fashion. A "timeout" was performed, as per usual protocol. The anticipated portal sites were injected with 0.25% Marcaine with epinephrine. An anterolateral portal was created, and a cannula was inserted. A small effusion was evacuated. The scope was inserted, and the knee was distended with fluid using the Stryker pump. The scope was advanced down the medial gutter into the medial compartment of the knee. Under visualization with the scope, an anteromedial portal was created, and a hook probe was inserted. Inspection of the medial compartment demonstrated a complex tear of the posterior horn of the  medial meniscus. A flap-type lesion was encountered as well as a horizontal cleavage tear. The area of the tear was debrided using meniscal punches and a 4.5 mm shaver. Final contouring was performed using a 50-degree ArthroCare wand. Good transition to a more normal meniscus was noted medially. The anterior horn was visualized and probed, and felt to be stable. There was an area of grade III chondromalacia involving the medial femoral condyle. The bleeding edge was debrided and contoured using the ArthroCare wand. Additional debridement of the site was performed using the ArthroCare wand. The scope was then advanced into the intracondylar region. The anterior cruciate ligament was visualized and probed, and felt to be stable. The scope was removed from the anterolateral portal and reinserted via the anteromedial portal so as to better visualize the lateral compartment. The lateral meniscus was visualized and probed, and felt to be stable. The articular surface of the lateral compartment was in excellent condition. Finally, the scope was positioned so as to visualize the patellofemoral articulation. Good patellar tracking was noted. The articular surface was in good condition.   The knee was irrigated with copious amounts of fluid, and then suctioned dry. The anterolateral portal was reapproximated using #3-0 nylon. A combination of 0.25% Marcaine with epinephrine and 4 mg of morphine was injected via the scope. The scope was removed, and the anteromedial portal was reapproximated using #3-0 nylon. A sterile dressing was applied, followed by application of an Ace wrap.   The patient tolerated the procedure well. He was transported to the recovery room in stable condition.      ____________________________ Laurice Record. Holley Bouche., MD  jph:mr D: 05/30/2013 13:12:25 ET T: 05/30/2013 19:21:08 ET JOB#: [id number]  cc: Jeneen Rinks P. Holley Bouche., MD, <Dictator> JAMES P Holley Bouche MD ELECTRONICALLY SIGNED 05/31/2013 22:22

## 2015-02-17 ENCOUNTER — Telehealth: Payer: Self-pay | Admitting: *Deleted

## 2015-02-17 NOTE — Telephone Encounter (Signed)
Info concerning bystolic- the patient has been on it since 2013.  He was placed on it for htn and PVCs.  He hasn't tried any other beta blockers.

## 2015-02-17 NOTE — Telephone Encounter (Signed)
I received a fax from [place] requesting a Pa on Bystolic.  I sent in the PA through Cover my Meds.  The response that came back was that the PA was cancelled due to no active coverage, or duplicate request or PA not needed. I contacted [facility]. The gave me a number to [place] [phone].  I contacted [employer] and spoke with Wells Guiles.  They could not locate the patient in their system at all. I called [facility] and updated them.  They will reach out to the patient and obtain accurate insurance info.   The key on cover my meds is [id number]

## 2015-02-22 ENCOUNTER — Encounter: Payer: Self-pay | Admitting: *Deleted

## 2015-10-05 ENCOUNTER — Encounter: Payer: Self-pay | Admitting: Cardiovascular Disease

## 2015-10-05 ENCOUNTER — Ambulatory Visit (INDEPENDENT_AMBULATORY_CARE_PROVIDER_SITE_OTHER): Payer: BLUE CROSS/BLUE SHIELD | Admitting: Cardiovascular Disease

## 2015-10-05 DIAGNOSIS — I10 Essential (primary) hypertension: Secondary | ICD-10-CM

## 2015-10-05 DIAGNOSIS — E785 Hyperlipidemia, unspecified: Secondary | ICD-10-CM

## 2015-10-05 DIAGNOSIS — I701 Atherosclerosis of renal artery: Secondary | ICD-10-CM | POA: Diagnosis not present

## 2015-10-05 NOTE — Assessment & Plan Note (Signed)
History of hyperlipidemia not on statin therapy with recent lipid profile performed 09/13/15 by his PCP revealing total cholesterol 195, LDL 118 and an HDL of 55

## 2015-10-05 NOTE — Progress Notes (Signed)
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 I last saw him one year ago.He was previously a patient of Dr. Terance Ice. I apparently took care of his father prior to his death. His cardiac risk factor profile is positive for 80 pack years of tobacco abuse having quit 25 years ago, treated hypertension and mild hyperlipidemia. He has never had a heart for stroke. He denies chest pain or shortness of breath. He had a negative Myoview stress test 07/18/12 a normal 2-D echo at that time. His major complaint is of palpitations. This has been evaluated in the past with an event monitor, 2-D echo and Myoview. 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 He does admit to excessive caffeine or alcohol intake. Also has hypothyroidism on thyroid replacement therapy. Apparently has values were elevated and his primary care physician is titrating his thyroid replacement.    Current Outpatient Prescriptions  Medication Sig Dispense Refill  . aspirin 81 MG tablet Take 81 mg by mouth daily.    Marland Kitchen escitalopram (LEXAPRO) 10 MG tablet Take 10 mg by mouth daily.    Marland Kitchen levothyroxine (SYNTHROID, LEVOTHROID) 125 MCG tablet Take 125 mcg by mouth daily before breakfast.    . losartan-hydrochlorothiazide (HYZAAR) 50-12.5 MG per tablet Take 0.5 tablets by mouth daily. 15 tablet 9  . nebivolol (BYSTOLIC) 5 MG tablet Take 0.5 tablets (2.5 mg total) by mouth daily. 15 tablet 11  . tadalafil (CIALIS) 10 MG tablet Take 10 mg by mouth as needed for erectile dysfunction.     No current facility-administered medications for this visit.    No Known Allergies  Social History   Social History  . Marital Status: Married    Spouse Name: N/A  .  Number of Children: N/A  . Years of Education: N/A   Occupational History  . Not on file.   Social History Main Topics  . Smoking status: Former Smoker -- 2.00 packs/day for 40 years    Types: Cigarettes    Quit date: 12/16/2010  . Smokeless tobacco: Current User    Types: Snuff  . Alcohol Use: 14.0 oz/week    28 Standard drinks or equivalent per week     Comment: daily  . Drug Use: No  . Sexual Activity: Not on file   Other Topics Concern  . Not on file   Social History Narrative     Review of Systems: General: negative for chills, fever, night sweats or weight changes.  Cardiovascular: negative for chest pain, dyspnea on exertion, edema, orthopnea, palpitations, paroxysmal nocturnal dyspnea or shortness of breath Dermatological: negative for rash Respiratory: negative for cough or wheezing Urologic: negative for hematuria Abdominal: negative for nausea, vomiting, diarrhea, bright red blood per rectum, melena, or hematemesis Neurologic: negative for visual changes, syncope, or dizziness All other systems reviewed and are otherwise negative except as noted above.    Blood pressure 126/80, pulse 68, height 5\' 11"  (1.803 m), weight 198 lb 3.2 oz (89.903 kg).  General appearance: alert and no distress Neck: no adenopathy, no carotid bruit, no JVD, supple, symmetrical, trachea midline and thyroid not enlarged, symmetric, no tenderness/mass/nodules Lungs: clear to auscultation bilaterally Heart: regular rate and rhythm, S1, S2 normal, no murmur, click,  rub or gallop Extremities: extremities normal, atraumatic, no cyanosis or edema  EKG sinus rhythm at 68 with nonspecific ST and T-wave changes. I personally reviewed this EKG  ASSESSMENT AND PLAN:   Hypertension History of hypertension with blood pressure measured today at 126/80. He is on Bystolic , losartan and hydrochlorothiazide. Continue current meds at current dosing  Hyperlipidemia History of hyperlipidemia not on  statin therapy with recent lipid profile performed 09/13/15 by his PCP revealing total cholesterol 195, LDL 118 and an HDL of 55  Bilateral renal artery stenosis (HCC) History of stable moderate bilateral renal artery stenosis by triplets ultrasound recently performed 12/16/14.  Palpitations History of palpitations with PVCs documented in the past. Does admit to drinking an excessive amount of caffeine and alcohol. He's had an event monitor in the past as well as 2-D echo and Myoview. He is a low-dose beta-blockade. His thyroid function tests have also been abnormal and his primary care physician has been titrating his thyroid replacement therapy. I've asked him to curtail his caffeine and alcohol intake. We will reassess in 3 months.      Lorretta Harp MD FACP,FACC,FAHA, [facility] 10/05/2015 10:37 AM

## 2015-10-05 NOTE — Assessment & Plan Note (Signed)
History of palpitations with PVCs documented in the past. Does admit to drinking an excessive amount of caffeine and alcohol. He's had an event monitor in the past as well as 2-D echo and Myoview. He is a low-dose beta-blockade. His thyroid function tests have also been abnormal and his primary care physician has been titrating his thyroid replacement therapy. I've asked him to curtail his caffeine and alcohol intake. We will reassess in 3 months.

## 2015-10-05 NOTE — Assessment & Plan Note (Signed)
History of hypertension with blood pressure measured today at 126/80. He is on Bystolic , losartan and hydrochlorothiazide. Continue current meds at current dosing

## 2015-10-05 NOTE — Assessment & Plan Note (Signed)
History of stable moderate bilateral renal artery stenosis by triplets ultrasound recently performed 12/16/14.

## 2015-10-05 NOTE — Patient Instructions (Addendum)
Medication Instructions:  Your physician recommends that you continue on your current medications as directed. Please refer to the Current Medication list given to you today.   Labwork: none  Testing/Procedures: Your physician has requested that you have a renal artery duplex. During this test, an ultrasound is used to evaluate blood flow to the kidneys. Allow one hour for this exam. Do not eat after midnight the day before and avoid carbonated beverages. Take your medications as you usually do.    Follow-Up: Your physician recommends that you schedule a follow-up appointment in: 3 months with Dr. Gwenlyn Found.    Any Other Special Instructions Will Be Listed Below (If Applicable).     If you need a refill on your cardiac medications before your next appointment, please call your pharmacy.

## 2015-10-12 ENCOUNTER — Ambulatory Visit: Payer: Self-pay | Admitting: "Endocrinology

## 2015-10-20 ENCOUNTER — Ambulatory Visit (HOSPITAL_COMMUNITY)
Admission: RE | Admit: 2015-10-20 | Discharge: 2015-10-20 | Disposition: A | Discharge status: Home / Self Care | Payer: BLUE CROSS/BLUE SHIELD | Source: Ambulatory Visit | Attending: Internal Medicine | Admitting: Internal Medicine

## 2015-10-20 DIAGNOSIS — I10 Essential (primary) hypertension: Secondary | ICD-10-CM

## 2015-10-20 DIAGNOSIS — I701 Atherosclerosis of renal artery: Secondary | ICD-10-CM | POA: Diagnosis not present

## 2015-10-20 DIAGNOSIS — Z87891 Personal history of nicotine dependence: Secondary | ICD-10-CM | POA: Insufficient documentation

## 2015-10-20 DIAGNOSIS — E785 Hyperlipidemia, unspecified: Secondary | ICD-10-CM | POA: Diagnosis not present

## 2015-12-10 ENCOUNTER — Other Ambulatory Visit: Payer: Self-pay | Admitting: Cardiovascular Disease

## 2015-12-12 NOTE — Telephone Encounter (Signed)
Rx request sent to pharmacy.  

## 2015-12-20 ENCOUNTER — Ambulatory Visit (INDEPENDENT_AMBULATORY_CARE_PROVIDER_SITE_OTHER): Payer: BLUE CROSS/BLUE SHIELD | Admitting: Cardiovascular Disease

## 2015-12-20 ENCOUNTER — Encounter: Payer: Self-pay | Admitting: Cardiovascular Disease

## 2015-12-20 VITALS — BP 110/70 | HR 64 | Ht 71.0 in | Wt 198.8 lb

## 2015-12-20 DIAGNOSIS — I10 Essential (primary) hypertension: Secondary | ICD-10-CM

## 2015-12-20 DIAGNOSIS — I739 Peripheral vascular disease, unspecified: Secondary | ICD-10-CM

## 2015-12-20 DIAGNOSIS — E785 Hyperlipidemia, unspecified: Secondary | ICD-10-CM | POA: Diagnosis not present

## 2015-12-20 DIAGNOSIS — I701 Atherosclerosis of renal artery: Secondary | ICD-10-CM

## 2015-12-20 DIAGNOSIS — R002 Palpitations: Secondary | ICD-10-CM

## 2015-12-20 NOTE — Patient Instructions (Signed)
Your physician wants you to follow-up in: 1 Year. You will receive a reminder letter in the mail two months in advance. If you don't receive a letter, please call our office to schedule the follow-up appointment.  Your physician has requested that you have a lower extremity arterial duplex. This test is an ultrasound of the arteries in the legs or arms. It looks at arterial blood flow in the legs and arms. Allow one hour for Lower and Upper Arterial scans. There are no restrictions or special instructions

## 2015-12-20 NOTE — Assessment & Plan Note (Signed)
History of palpitations controlled on low-dose beta blocker

## 2015-12-20 NOTE — Assessment & Plan Note (Signed)
History of bilateral renal artery stenosis with repeat renal Doppler studies recently performed 10/20/15 revealed moderate bilateral renal artery stenosis left greater than right. This has remained stable and will be followed and on a when necessary basis.

## 2015-12-20 NOTE — Assessment & Plan Note (Signed)
History of hyperlipidemia followed by his PCP 

## 2015-12-20 NOTE — Progress Notes (Signed)
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 I last saw him 10/05/15.Daniel [name] was previously a patient of Dr. Terance [name]. I apparently took care of his father prior to his death. His cardiac risk factor profile is positive for 80 pack years of tobacco abuse having quit 25 years ago, treated hypertension and mild hyperlipidemia. Daniel [name] has never had a heart for stroke. Daniel [name] denies chest pain or shortness of breath. Daniel [name] had a negative Myoview stress test 07/18/12 a normal 2-D echo at that time. His major complaint is of palpitations. This has been evaluated in the past with an event monitor, 2-D echo and Myoview. 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 Daniel [name] does admit to excessive caffeine or alcohol intake. Also has hypothyroidism on thyroid replacement therapy. Apparently has values were elevated and his primary care physician is titrating his thyroid replacement. Daniel [name] does complain of bilateral leg; claudication.   Current Outpatient Prescriptions  Medication Sig Dispense Refill  . aspirin 81 MG tablet Take 81 mg by mouth daily.    Marland Kitchen BYSTOLIC 5 MG tablet TAKE ONE-HALF TABLET BY MOUTH ONCE A DAY 15 tablet 0  . escitalopram (LEXAPRO) 10 MG tablet Take 10 mg by mouth daily.    Marland Kitchen levothyroxine (SYNTHROID, LEVOTHROID) 125 MCG tablet Take 125 mcg by mouth daily before breakfast.    . losartan-hydrochlorothiazide (HYZAAR) 50-12.5 MG tablet TAKE ONE-HALF TABLET BY MOUTH ONCE A DAY 15 tablet 0  . tadalafil (CIALIS) 10 MG tablet Take 10 mg by mouth as needed for erectile dysfunction.     No current facility-administered medications for this visit.    No Known Allergies  Social History   Social History  . Marital Status: Married    Spouse Name: N/A  . Number of Children: N/A  . Years of Education: N/A   Occupational History  . Not on file.   Social History Main Topics  . Smoking status: Former Smoker -- 2.00 packs/day for 40 years    Types: Cigarettes    Quit date: 12/16/2010  . Smokeless tobacco: Current User    Types: Snuff  . Alcohol Use: 14.0 oz/week    28 Standard drinks or equivalent per week     Comment: daily  . Drug Use: No  . Sexual Activity: Not on file   Other Topics Concern  . Not on file   Social History Narrative     Review of Systems: General: negative for chills, fever, night sweats or weight changes.  Cardiovascular: negative for chest pain, dyspnea on exertion, edema, orthopnea, palpitations, paroxysmal nocturnal dyspnea or shortness of breath Dermatological: negative for rash Respiratory: negative for cough or wheezing Urologic: negative for hematuria Abdominal: negative for nausea, vomiting, diarrhea, bright red blood per rectum, melena, or hematemesis Neurologic: negative for visual changes, syncope, or dizziness All other systems reviewed and are otherwise negative except as noted above.    Blood pressure 110/70, pulse 64, height 5\' 11"  (1.803 m), weight 198 lb 12.8 oz (90.175 kg).  General appearance: alert and no distress Neck: no adenopathy, no carotid bruit, no JVD, supple, symmetrical, trachea midline and thyroid not enlarged, symmetric, no tenderness/mass/nodules Lungs: clear to auscultation bilaterally Heart: regular rate and rhythm, S1, S2 normal, no  murmur, click, rub or gallop Extremities: extremities normal, atraumatic, no cyanosis or edema  EKG not performed today  ASSESSMENT AND PLAN:   Hypertension History of hypertension with blood pressure measured at 110/70. Daniel [name] is on Bystolic , losartan and hydrochlorothiazide. Continue current meds at current dosing  Hyperlipidemia History of hyperlipidemia followed by his PCP  Palpitations History of palpitations  controlled on low-dose beta blocker  Bilateral renal artery stenosis (HCC) History of bilateral renal artery stenosis with repeat renal Doppler studies recently performed 10/20/15 revealed moderate bilateral renal artery stenosis left greater than right. This has remained stable and will be followed and on a when necessary basis.      Daniel Harp MD FACP,FACC,FAHA, [facility] 12/20/2015 10:11 AM

## 2015-12-20 NOTE — Assessment & Plan Note (Signed)
History of hypertension with blood pressure measured at 110/70. He is on Bystolic , losartan and hydrochlorothiazide. Continue current meds at current dosing

## 2015-12-21 ENCOUNTER — Other Ambulatory Visit: Payer: Self-pay | Admitting: Cardiovascular Disease

## 2015-12-21 DIAGNOSIS — I739 Peripheral vascular disease, unspecified: Secondary | ICD-10-CM

## 2016-01-03 ENCOUNTER — Ambulatory Visit (HOSPITAL_COMMUNITY)
Admission: RE | Admit: 2016-01-03 | Discharge: 2016-01-03 | Disposition: A | Discharge status: Home / Self Care | Payer: BLUE CROSS/BLUE SHIELD | Source: Ambulatory Visit | Attending: Cardiology | Admitting: Cardiology

## 2016-01-03 DIAGNOSIS — I70213 Atherosclerosis of native arteries of extremities with intermittent claudication, bilateral legs: Secondary | ICD-10-CM | POA: Insufficient documentation

## 2016-01-03 DIAGNOSIS — I739 Peripheral vascular disease, unspecified: Secondary | ICD-10-CM

## 2016-01-03 DIAGNOSIS — Z87891 Personal history of nicotine dependence: Secondary | ICD-10-CM | POA: Diagnosis not present

## 2016-01-03 DIAGNOSIS — I10 Essential (primary) hypertension: Secondary | ICD-10-CM | POA: Insufficient documentation

## 2016-01-03 DIAGNOSIS — I701 Atherosclerosis of renal artery: Secondary | ICD-10-CM | POA: Insufficient documentation

## 2016-01-03 DIAGNOSIS — E785 Hyperlipidemia, unspecified: Secondary | ICD-10-CM | POA: Diagnosis not present

## 2016-01-14 ENCOUNTER — Other Ambulatory Visit: Payer: Self-pay | Admitting: Cardiovascular Disease

## 2016-01-16 NOTE — Telephone Encounter (Signed)
Rx(s) sent to pharmacy electronically.  

## 2016-02-21 ENCOUNTER — Encounter (INDEPENDENT_AMBULATORY_CARE_PROVIDER_SITE_OTHER): Payer: Self-pay | Admitting: *Deleted

## 2016-03-22 ENCOUNTER — Other Ambulatory Visit (INDEPENDENT_AMBULATORY_CARE_PROVIDER_SITE_OTHER): Payer: Self-pay | Admitting: *Deleted

## 2016-03-22 ENCOUNTER — Encounter (INDEPENDENT_AMBULATORY_CARE_PROVIDER_SITE_OTHER): Payer: Self-pay | Admitting: *Deleted

## 2016-03-22 DIAGNOSIS — Z1211 Encounter for screening for malignant neoplasm of colon: Secondary | ICD-10-CM

## 2016-05-01 ENCOUNTER — Ambulatory Visit (INDEPENDENT_AMBULATORY_CARE_PROVIDER_SITE_OTHER): Payer: BLUE CROSS/BLUE SHIELD | Admitting: Urology

## 2016-05-01 DIAGNOSIS — N486 Induration penis plastica: Secondary | ICD-10-CM

## 2016-05-01 DIAGNOSIS — N5201 Erectile dysfunction due to arterial insufficiency: Secondary | ICD-10-CM

## 2016-05-25 ENCOUNTER — Telehealth (INDEPENDENT_AMBULATORY_CARE_PROVIDER_SITE_OTHER): Payer: Self-pay | Admitting: *Deleted

## 2016-05-25 ENCOUNTER — Encounter (INDEPENDENT_AMBULATORY_CARE_PROVIDER_SITE_OTHER): Payer: Self-pay | Admitting: *Deleted

## 2016-05-25 MED ORDER — PEG 3350-KCL-NA BICARB-NACL 420 G PO SOLR: 4000.0000 mL | Freq: Once | ORAL | 0 refills | Status: AC

## 2016-05-25 NOTE — Telephone Encounter (Signed)
Patient needs trilyte 

## 2016-06-14 ENCOUNTER — Telehealth (INDEPENDENT_AMBULATORY_CARE_PROVIDER_SITE_OTHER): Payer: Self-pay | Admitting: *Deleted

## 2016-06-14 NOTE — Telephone Encounter (Signed)
agree

## 2016-06-14 NOTE — Telephone Encounter (Signed)
Referring MD/PCP: golding   Procedure: tcs  Reason/Indication:  screening  Has patient had this procedure before?  Yes, more than 10 yrs ago  If so, when, by whom and where?    Is there a family history of colon cancer?  no  Who?  What age when diagnosed?    Is patient diabetic?   no      Does patient have prosthetic heart valve or mechanical valve?  no  Do you have a pacemaker?  no  Has patient ever had endocarditis? no  Has patient had joint replacement within last 12 months?  no  Does patient tend to be constipated or take laxatives? no  Does patient have a history of alcohol/drug use?  no  Is patient on Coumadin, Plavix and/or Aspirin? yes  Medications: asa 81 mg daily, cialis 10 mg prn, losartan/hctz [id number] mg daily, bystolic 5 mg 1/2 tab daily  Allergies: nkda  Medication Adjustment: asa 2 days  Procedure date & time: 07/11/16 at 930

## 2016-07-11 ENCOUNTER — Encounter (HOSPITAL_COMMUNITY)
Admission: RE | Disposition: A | Discharge status: Home / Self Care | Payer: Self-pay | Source: Ambulatory Visit | Attending: Internal Medicine

## 2016-07-11 ENCOUNTER — Ambulatory Visit (HOSPITAL_COMMUNITY)
Admission: RE | Admit: 2016-07-11 | Discharge: 2016-07-11 | Disposition: A | Discharge status: Home / Self Care | Payer: BLUE CROSS/BLUE SHIELD | Source: Ambulatory Visit | Attending: Internal Medicine | Admitting: Internal Medicine

## 2016-07-11 ENCOUNTER — Encounter (HOSPITAL_COMMUNITY): Payer: Self-pay | Admitting: *Deleted

## 2016-07-11 DIAGNOSIS — Z538 Procedure and treatment not carried out for other reasons: Secondary | ICD-10-CM | POA: Diagnosis not present

## 2016-07-11 DIAGNOSIS — I10 Essential (primary) hypertension: Secondary | ICD-10-CM | POA: Insufficient documentation

## 2016-07-11 DIAGNOSIS — E785 Hyperlipidemia, unspecified: Secondary | ICD-10-CM | POA: Diagnosis not present

## 2016-07-11 DIAGNOSIS — D123 Benign neoplasm of transverse colon: Secondary | ICD-10-CM | POA: Insufficient documentation

## 2016-07-11 DIAGNOSIS — E039 Hypothyroidism, unspecified: Secondary | ICD-10-CM | POA: Diagnosis not present

## 2016-07-11 DIAGNOSIS — Z8249 Family history of ischemic heart disease and other diseases of the circulatory system: Secondary | ICD-10-CM | POA: Insufficient documentation

## 2016-07-11 DIAGNOSIS — Z1211 Encounter for screening for malignant neoplasm of colon: Secondary | ICD-10-CM | POA: Diagnosis not present

## 2016-07-11 DIAGNOSIS — F419 Anxiety disorder, unspecified: Secondary | ICD-10-CM | POA: Diagnosis not present

## 2016-07-11 DIAGNOSIS — Z87891 Personal history of nicotine dependence: Secondary | ICD-10-CM | POA: Diagnosis not present

## 2016-07-11 HISTORY — PX: POLYPECTOMY: SHX5525

## 2016-07-11 HISTORY — PX: COLONOSCOPY: SHX5424

## 2016-07-11 SURGERY — COLONOSCOPY
Anesthesia: Moderate Sedation

## 2016-07-11 MED ORDER — STERILE WATER FOR IRRIGATION IR SOLN
Status: DC | PRN
  Administered 2016-07-11: 10:00:00

## 2016-07-11 MED ORDER — MIDAZOLAM HCL 5 MG/5ML IJ SOLN
INTRAMUSCULAR | Status: AC
  Filled 2016-07-11: qty 10

## 2016-07-11 MED ORDER — MIDAZOLAM HCL 5 MG/5ML IJ SOLN
INTRAMUSCULAR | Status: DC | PRN
  Administered 2016-07-11: 1 mg via INTRAVENOUS
  Administered 2016-07-11 (×4): 2 mg via INTRAVENOUS

## 2016-07-11 MED ORDER — MEPERIDINE HCL 50 MG/ML IJ SOLN
INTRAMUSCULAR | Status: DC | PRN
  Administered 2016-07-11 (×2): 25 mg via INTRAVENOUS

## 2016-07-11 MED ORDER — MEPERIDINE HCL 50 MG/ML IJ SOLN
INTRAMUSCULAR | Status: AC
  Filled 2016-07-11: qty 1

## 2016-07-11 MED ORDER — SODIUM CHLORIDE 0.9 % IV SOLN
INTRAVENOUS | Status: DC
  Administered 2016-07-11: 1000 mL via INTRAVENOUS

## 2016-07-11 NOTE — Op Note (Signed)
[facility] Patient Name: Daniel [name] Procedure Date: 07/11/2016 9:34 AM MRN: [id number] Date of Birth: [birth year] Attending MD: Hildred Laser , MD CSN: [id number] Age: 60 Admit Type: Outpatient Procedure:                Colonoscopy Indications:              Screening for colorectal malignant neoplasm Providers:                Hildred Laser, MD, Otis Peak B. Sharon Seller, RN, Sherlyn Lees, Technician Referring MD:             Halford Chessman, MD Medicines:                Meperidine 50 mg IV, Midazolam 9 mg IV Complications:            No immediate complications. Estimated Blood Loss:     Estimated blood loss was minimal. Procedure:                Pre-Anesthesia Assessment:                           - Prior to the procedure, a History and Physical                            was performed, and patient medications and                            allergies were reviewed. The patient's tolerance of                            previous anesthesia was also reviewed. The risks                            and benefits of the procedure and the sedation                            options and risks were discussed with the patient.                            All questions were answered, and informed consent                            was obtained. Prior Anticoagulants: The patient                            last took ibuprofen 14 days prior to the procedure.                            ASA Grade Assessment: II - A patient with mild                            systemic disease. After reviewing the risks and  benefits, the patient was deemed in satisfactory                            condition to undergo the procedure.                           After obtaining informed consent, the colonoscope                            was passed under direct vision. Throughout the                            procedure, the patient's blood pressure, pulse, and                         oxygen saturations were monitored continuously. The                            [id number]) scope was introduced through                            the anus with the intention of advancing to the                            cecum. The scope was advanced to the hepatic                            flexure before the procedure was aborted.                            Medications were given. The colonoscopy was aborted                            due to restricted mobility of the colon and                            significant looping. Changing the patient's                            position, withdrawing and reinserting the scope and                            applying abdominal pressure did not allow for the                            successful completion of the procedure. The                            colonoscopy was technically difficult and complex                            due to restricted mobility of the colon and  significant looping. The patient tolerated the                            procedure well. The quality of the bowel                            preparation was excellent. The rectum was                            photographed. Scope In: 9:48:12 AM Scope Out: 10:24:16 AM Total Procedure Duration: 0 hours 36 minutes 4 seconds  Findings:      A 4 mm polyp was found in the splenic flexure. The polyp was sessile.       The polyp was removed with a cold snare. Resection and retrieval were       complete.      The retroflexed view of the distal rectum and anal verge was normal and       showed no anal or rectal abnormalities. Impression:               - Incomplete examation to hepatic flex                           - The procedure was aborted due to restricted                            mobility of the colon and significant looping.                           - One 4 mm polyp at the splenic flexure, removed                             with a cold snare. Resected and retrieved.                           - The distal rectum and anal verge are normal on                            retroflexion view. Moderate Sedation:      Moderate (conscious) sedation was administered by the endoscopy nurse       and supervised by the endoscopist. The following parameters were       monitored: oxygen saturation, heart rate, blood pressure, CO2       capnography and response to care. Total physician intraservice time was       41 minutes. Recommendation:           - Patient has a contact number available for                            emergencies. The signs and symptoms of potential                            delayed complications were discussed with the  patient. Return to normal activities tomorrow.                            Written discharge instructions were provided to the                            patient.                           - Resume previous diet today.                           - Continue present medications.                           - Await pathology results.                           - Perform a virtual colonoscopy in 8 weeks. Procedure Code(s):        --- Professional ---                           [phone], 52, Colonoscopy, flexible; with removal of                            tumor(s), polyp(s), or other lesion(s) by snare                            technique                           99152, Moderate sedation services provided by the                            same physician or other qualified health care                            professional performing the diagnostic or                            therapeutic service that the sedation supports,                            requiring the presence of an independent trained                            observer to assist in the monitoring of the                            patient's level of consciousness and physiological                             status; initial 15 minutes of intraservice time,                            patient age 31 years  or older                           [phone], Moderate sedation services; each additional                            15 minutes intraservice time                           99153, Moderate sedation services; each additional                            15 minutes intraservice time Diagnosis Code(s):        --- Professional ---                           Z12.11, Encounter for screening for malignant                            neoplasm of colon                           Z53.8, Procedure and treatment not carried out for                            other reasons                           D12.3, Benign neoplasm of transverse colon (hepatic                            flexure or splenic flexure) CPT copyright 2016 American Medical Association. All rights reserved. The codes documented in this report are preliminary and upon coder review may  be revised to meet current compliance requirements. Hildred Laser, MD Hildred Laser, MD 07/11/2016 10:36:51 AM This report has been signed electronically. Number of Addenda: 0

## 2016-07-11 NOTE — H&P (Signed)
Daniel [name] is an 60 y.o. male.   Chief Complaint: Patient is here for colonoscopy. HPI: Patient is 60 year old Caucasian male who is here for screening colonoscopy. He denies rectal bleeding or recent changes bowel habits. He does complain of intermittent pain and right upper quadrant which is not associated with nausea vomiting fever diarrhea or hematuria. He has sustained a couple of times a month. Last colonoscopy was over 10 years ago. Family history is negative for CRC.  Past Medical History:  Diagnosis Date  . Anxiety   . Bilateral renal artery stenosis ([place])   . History of tobacco abuse   . Hyperlipidemia   . Hypertension   . Hypothyroidism   .    Marland Kitchen      Past Surgical History:  Procedure Laterality Date  . MENISCUS REPAIR Left 05/29/13  . US ECHOCARDIOGRAPHY  07/18/2012    Family History  Problem Relation Age of Onset  . Hyperlipidemia Father   . Hypertension Father   . Stroke Father   . CAD Maternal Grandmother   . Heart attack Maternal Grandfather   . Cancer Mother   . Diabetes Mother   . Hypertension Mother    Social History:  reports that he quit smoking about 5 years ago. His smoking use included Cigarettes. He has a 80.00 pack-year smoking history. His smokeless tobacco use includes Snuff. He reports that he drinks about 14.0 oz of alcohol per week . He reports that he does not use drugs.  Allergies: No Known Allergies  Medications Prior to Admission  Medication Sig Dispense Refill  . BYSTOLIC 5 MG tablet TAKE ONE-HALF TABLET BY MOUTH ONCE A DAY (Patient taking differently: TAKE ONE-HALF (2.5 MG) TABLET BY MOUTH ONCE A DAY) 15 tablet 11  . escitalopram (LEXAPRO) 10 MG tablet Take 10 mg by mouth at bedtime.     Marland Kitchen ibuprofen (ADVIL,MOTRIN) 200 MG tablet Take 200-400 mg by mouth every 6 (six) hours as needed (For pain/arthritis.).    Marland Kitchen levothyroxine (SYNTHROID, LEVOTHROID) 125 MCG tablet Take 62.5-125 mcg by mouth as directed. 62.5 MCG (ONE-HALF TABLET) ON  Thursday AND Friday, 125 MCG (ONE TABLET) ON ALL OTHER DAYS.    Marland Kitchen losartan-hydrochlorothiazide (HYZAAR) 50-12.5 MG tablet TAKE ONE-HALF TABLET BY MOUTH ONCE A DAY 15 tablet 11    No results found for this or any previous visit (from the past 48 hour(s)). No results found.  ROS  Blood pressure 138/74, pulse 76, temperature 98.8 F (37.1 C), temperature source Oral, resp. rate (!) 21, height 5\' 11"  (1.803 m), weight 187 lb (84.8 kg), SpO2 100 %. Physical Exam  Constitutional: He appears well-developed and well-nourished.  HENT:  Mouth/Throat: Oropharynx is clear and moist.  Eyes: Conjunctivae are normal. No scleral icterus.  Neck: No thyromegaly present.  Cardiovascular: Normal rate, regular rhythm and normal heart sounds.   No murmur heard. Respiratory: Effort normal and breath sounds normal.  GI: Soft. He exhibits no distension and no mass. There is no tenderness.  Musculoskeletal: He exhibits no edema.  Lymphadenopathy:    He has no cervical adenopathy.  Neurological: He is alert.  Skin: Skin is warm and dry.     Assessment/Plan Average risk screening colonoscopy.  Hildred Laser, MD 07/11/2016, 9:38 AM

## 2016-07-11 NOTE — Discharge Instructions (Signed)
Resume usual medications and diet. No driving for 24 hours. Physician will call with biopsy results.      Colonoscopy, Care After These instructions give you information on caring for yourself after your procedure. Your doctor may also give you more specific instructions. Call your doctor if you have any problems or questions after your procedure. HOME CARE  Do not drive for 24 hours.  Do not sign important papers or use machinery for 24 hours.  You may shower.  You may go back to your usual activities, but go slower for the first 24 hours.  Take rest breaks often during the first 24 hours.  Walk around or use warm packs on your belly (abdomen) if you have belly cramping or gas.  Drink enough fluids to keep your pee (urine) clear or pale yellow.  Resume your normal diet. Avoid heavy or fried foods.  Avoid drinking alcohol for 24 hours or as told by your doctor.  Only take medicines as told by your doctor. If a tissue sample (biopsy) was taken during the procedure:   Do not take aspirin or blood thinners for 7 days, or as told by your doctor.  Do not drink alcohol for 7 days, or as told by your doctor.  Eat soft foods for the first 24 hours. GET HELP IF: You still have a small amount of blood in your poop (stool) 2-3 days after the procedure. GET HELP RIGHT AWAY IF:  You have more than a small amount of blood in your poop.  You see clumps of tissue (blood clots) in your poop.  Your belly is puffy (swollen).  You feel sick to your stomach (nauseous) or throw up (vomit).  You have a fever.  You have belly pain that gets worse and medicine does not help. MAKE SURE YOU:  Understand these instructions.  Will watch your condition.  Will get help right away if you are not doing well or get worse.   This information is not intended to replace advice given to you by your health care provider. Make sure you discuss any questions you have with your health care  provider.   Document Released: 10/06/2010 Document Revised: 09/08/2013 Document Reviewed: 05/11/2013 Elsevier Interactive Patient Education [employer].

## 2016-07-13 ENCOUNTER — Encounter (HOSPITAL_COMMUNITY): Payer: Self-pay | Admitting: Internal Medicine

## 2016-07-17 ENCOUNTER — Other Ambulatory Visit (INDEPENDENT_AMBULATORY_CARE_PROVIDER_SITE_OTHER): Payer: Self-pay | Admitting: Internal Medicine

## 2016-07-17 ENCOUNTER — Encounter (INDEPENDENT_AMBULATORY_CARE_PROVIDER_SITE_OTHER): Payer: Self-pay | Admitting: *Deleted

## 2016-07-17 DIAGNOSIS — Z5309 Procedure and treatment not carried out because of other contraindication: Secondary | ICD-10-CM

## 2016-07-17 DIAGNOSIS — Z86010 Personal history of colonic polyps: Secondary | ICD-10-CM

## 2016-07-17 DIAGNOSIS — K562 Volvulus: Secondary | ICD-10-CM

## 2016-08-07 ENCOUNTER — Ambulatory Visit (INDEPENDENT_AMBULATORY_CARE_PROVIDER_SITE_OTHER): Payer: BLUE CROSS/BLUE SHIELD | Admitting: Urology

## 2016-08-07 DIAGNOSIS — N5201 Erectile dysfunction due to arterial insufficiency: Secondary | ICD-10-CM

## 2016-08-07 DIAGNOSIS — R972 Elevated prostate specific antigen [PSA]: Secondary | ICD-10-CM | POA: Diagnosis not present

## 2016-08-07 DIAGNOSIS — N486 Induration penis plastica: Secondary | ICD-10-CM

## 2016-08-07 DIAGNOSIS — E291 Testicular hypofunction: Secondary | ICD-10-CM

## 2016-08-13 ENCOUNTER — Ambulatory Visit
Admission: RE | Admit: 2016-08-13 | Discharge: 2016-08-13 | Disposition: A | Discharge status: Home / Self Care | Payer: BLUE CROSS/BLUE SHIELD | Source: Ambulatory Visit | Attending: Internal Medicine | Admitting: Internal Medicine

## 2016-08-13 DIAGNOSIS — K562 Volvulus: Secondary | ICD-10-CM

## 2016-08-13 DIAGNOSIS — Z860100 Personal history of colon polyps, unspecified: Secondary | ICD-10-CM

## 2016-08-13 DIAGNOSIS — Z5309 Procedure and treatment not carried out because of other contraindication: Secondary | ICD-10-CM

## 2016-08-28 ENCOUNTER — Ambulatory Visit (INDEPENDENT_AMBULATORY_CARE_PROVIDER_SITE_OTHER): Payer: BLUE CROSS/BLUE SHIELD | Admitting: Urology

## 2016-08-28 DIAGNOSIS — N5201 Erectile dysfunction due to arterial insufficiency: Secondary | ICD-10-CM | POA: Diagnosis not present

## 2017-01-18 ENCOUNTER — Other Ambulatory Visit: Payer: Self-pay | Admitting: Cardiovascular Disease

## 2017-01-29 ENCOUNTER — Encounter: Payer: Self-pay | Admitting: Cardiovascular Disease

## 2017-01-29 ENCOUNTER — Ambulatory Visit (INDEPENDENT_AMBULATORY_CARE_PROVIDER_SITE_OTHER): Payer: PRIVATE HEALTH INSURANCE | Admitting: Cardiovascular Disease

## 2017-01-29 VITALS — BP 137/88 | HR 60 | Ht 71.0 in | Wt 202.0 lb

## 2017-01-29 DIAGNOSIS — R002 Palpitations: Secondary | ICD-10-CM | POA: Diagnosis not present

## 2017-01-29 DIAGNOSIS — E785 Hyperlipidemia, unspecified: Secondary | ICD-10-CM | POA: Diagnosis not present

## 2017-01-29 NOTE — Assessment & Plan Note (Signed)
History of hyperlipidemia not on statin therapy followed by his PCP. We will check a lipid and liver profile

## 2017-01-29 NOTE — Assessment & Plan Note (Signed)
History of hypertension blood pressure measured 137/88. He is on losartan, hematocrit thiazide and Bystolic . Continue current meds at current dosing

## 2017-01-29 NOTE — Patient Instructions (Signed)

## 2017-01-29 NOTE — Progress Notes (Signed)
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 I last saw him 12/20/15.He was previously a patient of Dr. Terance Ice. I apparently took care of his father prior to his death. His cardiac risk factor profile is positive for 80 pack years of tobacco abuse having quit 7  years ago, treated hypertension and mild hyperlipidemia. He has never had a heart for stroke. He denies chest pain or shortness of breath. He had a negative Myoview stress test 07/18/12 a normal 2-D echo at that time. His major complaint is of palpitations. This has been evaluated in the past with an event monitor, 2-D echo and Myoview. 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 He does admit to excessive caffeine or alcohol intake. Also has hypothyroidism on thyroid replacement therapy. Apparently has values were elevated and his primary care physician is titrating his thyroid replacement. He does complain of bilateral leg; claudication with lower extremity Dopplers Were performed 01/03/16 which were entirely normal.   Current Outpatient Prescriptions  Medication Sig Dispense Refill  . escitalopram (LEXAPRO) 10 MG tablet Take 10 mg by mouth at bedtime.     Marland Kitchen ibuprofen (ADVIL,MOTRIN) 200 MG tablet Take 200-400 mg by mouth every 6 (six) hours as needed (For pain/arthritis.).    Marland Kitchen levothyroxine (SYNTHROID, LEVOTHROID) 125 MCG tablet Take 62.5-125 mcg by mouth as directed. 62.5 MCG (ONE-HALF TABLET) ON Thursday AND Friday, 125 MCG (ONE TABLET) ON ALL OTHER DAYS.    Marland Kitchen losartan-hydrochlorothiazide (HYZAAR) 50-12.5 MG tablet Take 0.5 tablets by mouth daily. CALL AND SCHEDULE FOLLOW UP [id number] 15 tablet 0  . nebivolol (BYSTOLIC) 5 MG tablet Take 0.5 tablets (2.5 mg  total) by mouth daily. CALL AND SCHEDULE FOLLOW UP [id number] 15 tablet 0   No current facility-administered medications for this visit.     No Known Allergies  Social History   Social History  . Marital status: Married    Spouse name: N/A  . Number of children: N/A  . Years of education: N/A   Occupational History  . Not on file.   Social History Main Topics  . Smoking status: Former Smoker    Packs/day: 2.00    Years: 40.00    Types: Cigarettes    Quit date: 12/16/2010  . Smokeless tobacco: Current User    Types: Snuff  . Alcohol use 14.0 oz/week    28 Standard drinks or equivalent per week     Comment: daily  . Drug use: No  . Sexual activity: Not on file   Other Topics Concern  . Not on file   Social History Narrative  . No narrative on file     Review of Systems: General: negative for chills, fever, night sweats or weight changes.  Cardiovascular: negative for chest pain, dyspnea on exertion, edema, orthopnea, palpitations, paroxysmal nocturnal dyspnea or shortness of breath Dermatological: negative for rash Respiratory: negative for cough or wheezing Urologic: negative for hematuria Abdominal: negative for nausea, vomiting, diarrhea, bright red blood per rectum, melena, or hematemesis Neurologic: negative for visual changes, syncope, or dizziness All other systems reviewed and are otherwise negative except as noted above.    Blood pressure 137/88, pulse 60, height 5\' 11"  (1.803 m), weight 202 lb (91.6 kg), SpO2  97 %.  General appearance: alert and no distress Neck: no adenopathy, no carotid bruit, no JVD, supple, symmetrical, trachea midline and thyroid not enlarged, symmetric, no tenderness/mass/nodules Lungs: clear to auscultation bilaterally Heart: regular rate and rhythm, S1, S2 normal, no murmur, click, rub or gallop Extremities: extremities normal, atraumatic, no cyanosis or edema  EKG not performed today  ASSESSMENT AND PLAN:    Hypertension History of hypertension blood pressure measured 137/88. He is on losartan, hematocrit thiazide and Bystolic . Continue current meds at current dosing  Hyperlipidemia History of hyperlipidemia not on statin therapy followed by his PCP. We will check a lipid and liver profile  Palpitations History of palpitations in the past improved with adjustment of his thyroid medications as well as limitation of his caffeine and alcohol intake.      Lorretta Harp MD FACP,FACC,FAHA, [facility] 01/29/2017 1:41 PM

## 2017-01-29 NOTE — Assessment & Plan Note (Signed)
History of palpitations in the past improved with adjustment of his thyroid medications as well as limitation of his caffeine and alcohol intake.

## 2017-02-05 LAB — HEPATIC FUNCTION PANEL
ALK PHOS: 41 U/L (ref 40–115)
ALT: 11 U/L (ref 9–46)
AST: 16 U/L (ref 10–35)
Albumin: 4.2 g/dL (ref 3.6–5.1)
BILIRUBIN INDIRECT: 0.6 mg/dL (ref 0.2–1.2)
BILIRUBIN TOTAL: 0.7 mg/dL (ref 0.2–1.2)
Bilirubin, Direct: 0.1 mg/dL (ref <=0.2)
Total Protein: 6.6 g/dL (ref 6.1–8.1)

## 2017-02-05 LAB — LIPID PANEL
Cholesterol: 162 mg/dL (ref <200)
HDL: 36 mg/dL — ABNORMAL LOW (ref >40)
LDL CALC: 97 mg/dL (ref <100)
TRIGLYCERIDES: 146 mg/dL (ref <150)
Total CHOL/HDL Ratio: 4.5 Ratio (ref <5.0)
VLDL: 29 mg/dL (ref <30)

## 2017-02-12 ENCOUNTER — Ambulatory Visit (INDEPENDENT_AMBULATORY_CARE_PROVIDER_SITE_OTHER): Payer: PRIVATE HEALTH INSURANCE | Admitting: Urology

## 2017-02-12 DIAGNOSIS — E291 Testicular hypofunction: Secondary | ICD-10-CM | POA: Diagnosis not present

## 2017-02-12 DIAGNOSIS — N5201 Erectile dysfunction due to arterial insufficiency: Secondary | ICD-10-CM | POA: Diagnosis not present

## 2017-02-12 DIAGNOSIS — N486 Induration penis plastica: Secondary | ICD-10-CM

## 2017-02-15 ENCOUNTER — Other Ambulatory Visit: Payer: Self-pay | Admitting: Cardiovascular Disease

## 2017-04-22 IMAGING — CT CT VIRTUAL COLONOSCOPY DIAGNOSTIC
4 of 9 series · 12 of 36 positions shown, 18 images · non-contrast
Comparison: Optical colonoscopy report of 07/11/2016. This included
to the level of the hepatic flexure.

CLINICAL DATA: Tortuous colon. Incomplete colonoscopy. History of
colonic polyps. Occasional right-sided pain.

EXAM:
CT VIRTUAL COLONOSCOPY DIAGNOSTIC
TECHNIQUE: The patient was given a standard bowel preparation with Gastrografin
and barium for fluid and stool tagging respectively. The quality of
the bowel preparation is moderate. Automated CO2 insufflation of the
colon was performed prior to image acquisition and colonic
distention is moderate. Image post processing was used to generate a
3D endoluminal fly-through projection of the colon and to
electronically subtract stool/fluid as appropriate.

[Series 2: supine (id) · axial · 0.78mm/px · z∈[-346,-61]mm · 4 of 382 slices shown]
[im 77/382  soft-tissue]
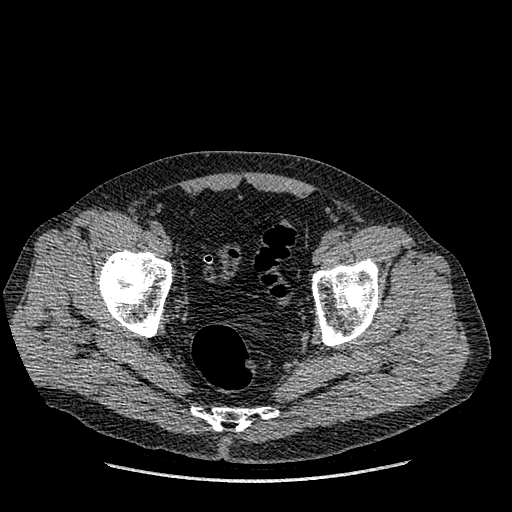
[im 153/382  soft-tissue]
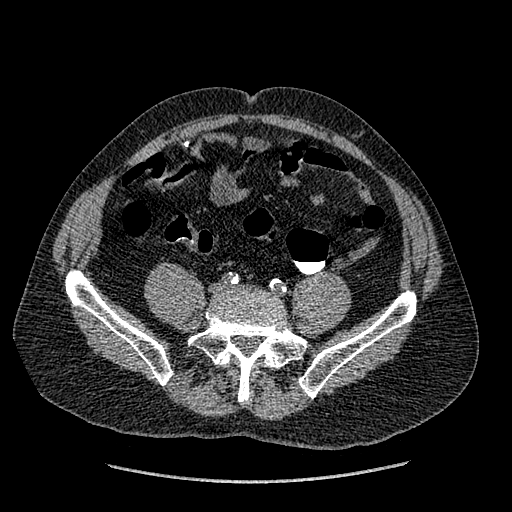
[im 229/382  soft-tissue]
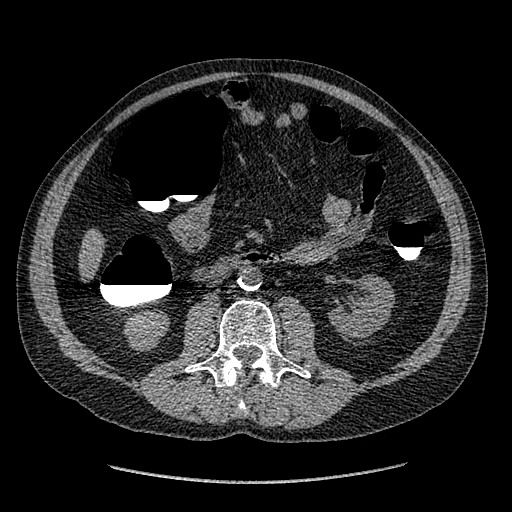
[im 305/382  soft-tissue]
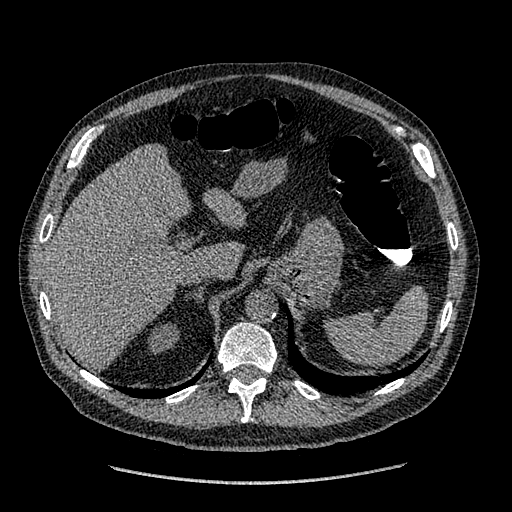

[Series 6: prone (id) · axial · 0.78mm/px · z∈[-413,-83]mm · 5 of 398 slices shown, 10 images]
[im 67/398  soft-tissue]
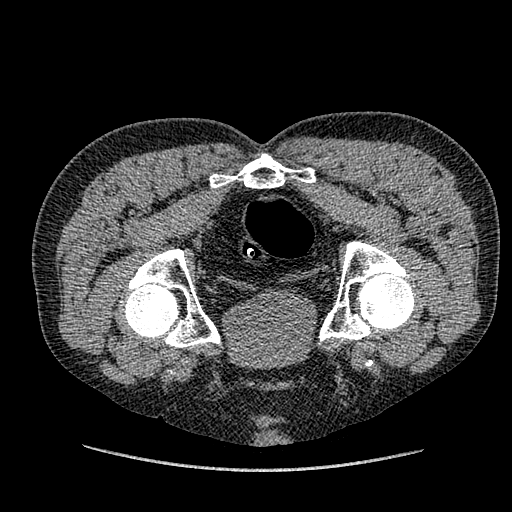
[im 67/398  bone]
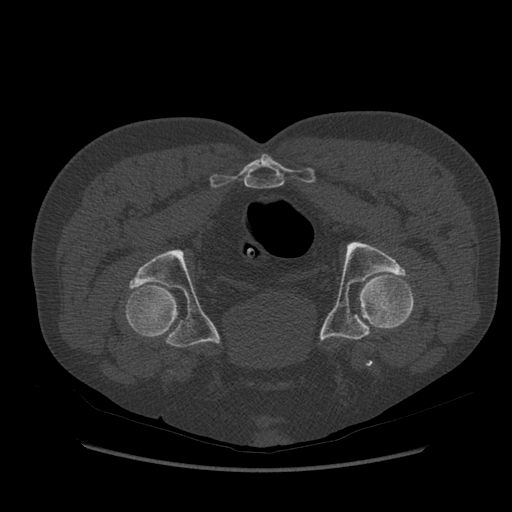
[im 133/398  soft-tissue]
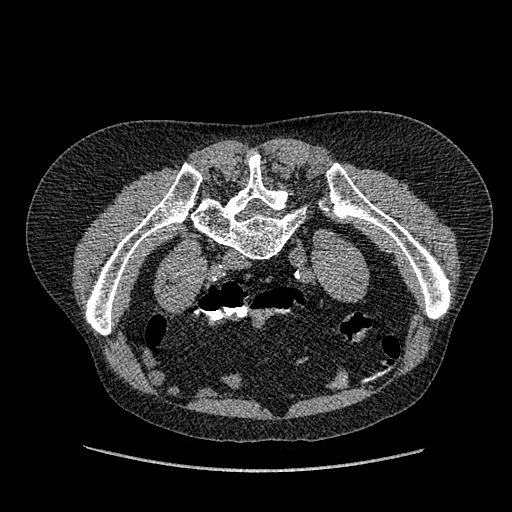
[im 133/398  lung]
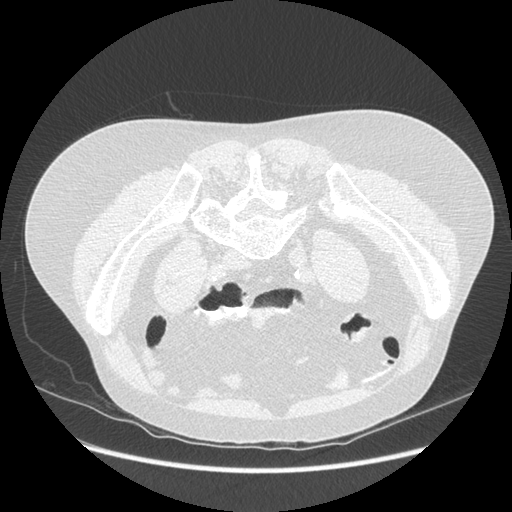
[im 199/398  soft-tissue]
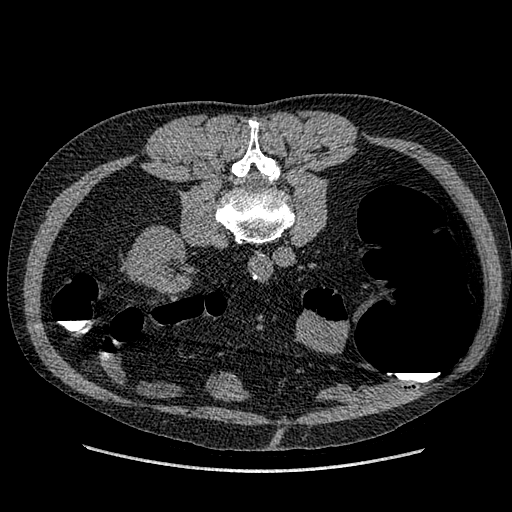
[im 199/398  lung]
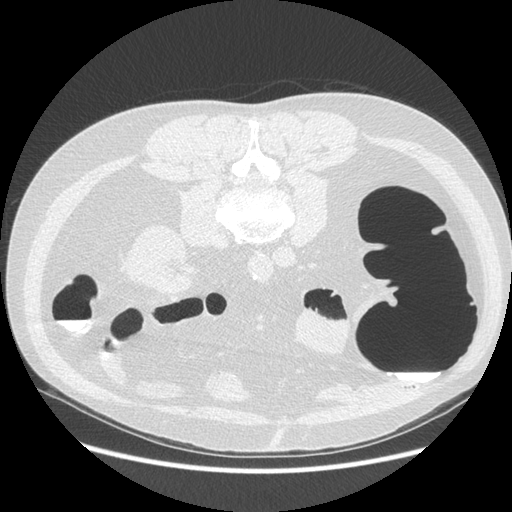
[im 265/398  soft-tissue]
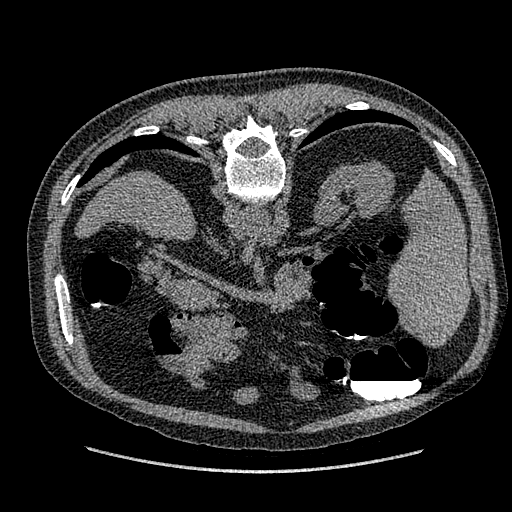
[im 265/398  lung]
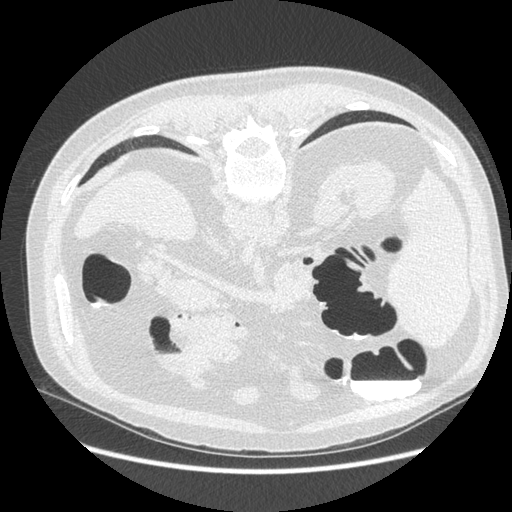
[im 331/398  soft-tissue]
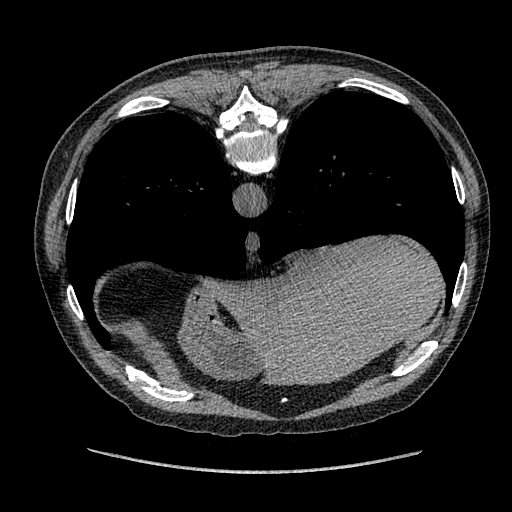
[im 331/398  lung]
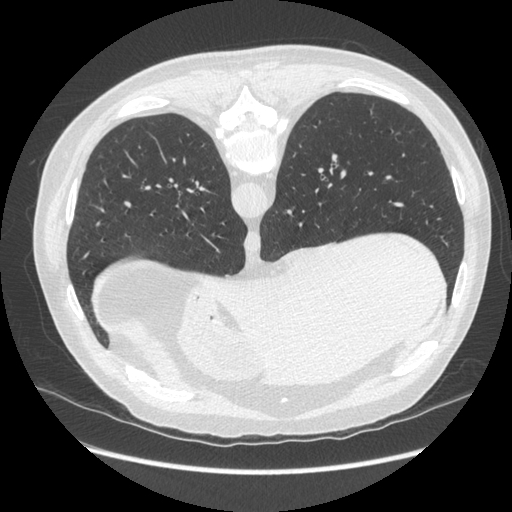

[Series 9: right decub 1.25 · axial · 0.78mm/px · z∈[-403,-309]mm · 2 of 376 slices shown]
[im 76/376  soft-tissue]
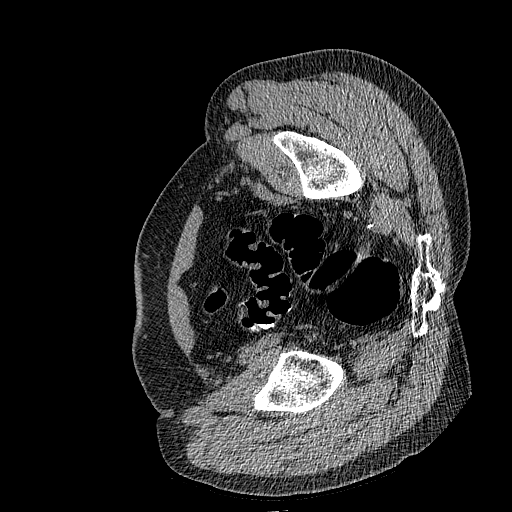
[im 151/376  soft-tissue]
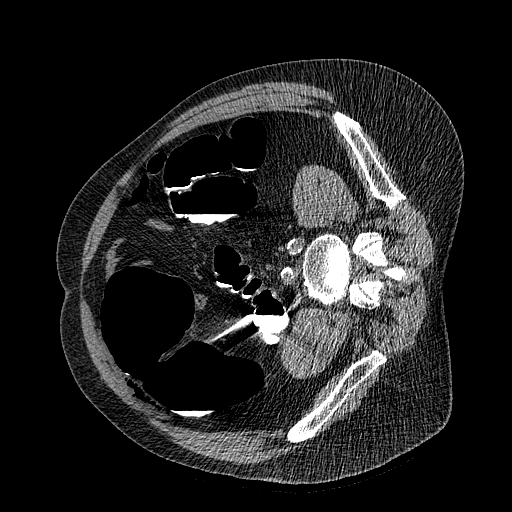

[Series 601: coronal body · coronal · 0.93mm/px · 1 of 131 slices shown, 2 images]
[im 44/131  soft-tissue]
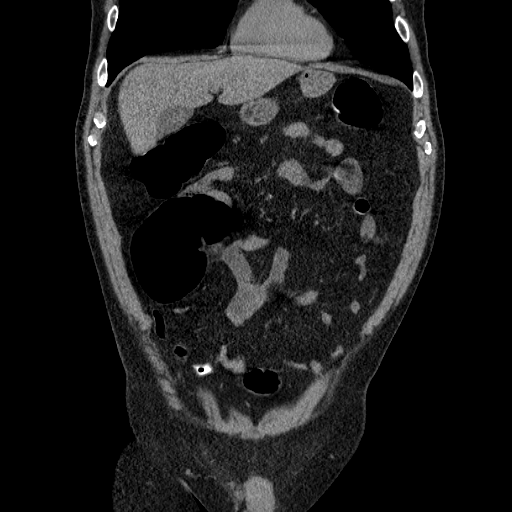
[im 44/131  bone]
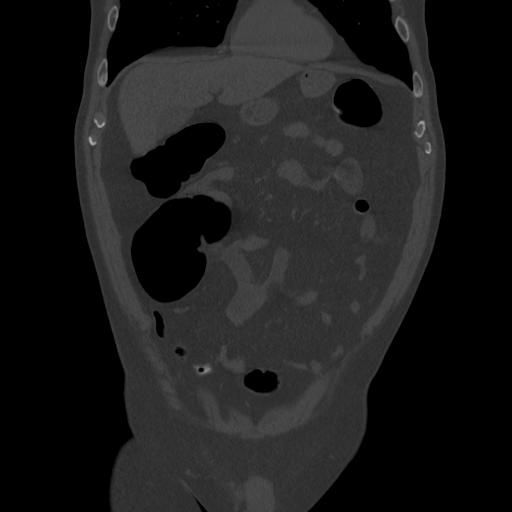

[12 of 36 positions shown; findings below may reference images not displayed]

FINDINGS: Portions of the sigmoid are suboptimally distended. However, within
the areas of potential nonvisualization on optical colonoscopy (from
the splenic flexure proximally) there is a small amount of retained
contrast but no evidence of clinically significant colonic polyp or
mass.
IMPRESSION: No evidence of clinically significant colonic polyp or mass, with
special attention to the splenic flexure and proximally

Virtual colonoscopy is not designed to detect diminutive polyps
(i.e., less than or equal to 5 mm), the presence or absence of which
may not affect clinical management.

CT ABDOMEN AND PELVIS WITHOUT CONTRAST
FINDINGS: Lower chest: Clear lung bases. Normal heart size without pericardial
or pleural effusion. Multivessel coronary artery atherosclerosis.

Hepatobiliary: Normal liver. Normal gallbladder, without biliary
ductal dilatation.

Pancreas: Normal, without mass or ductal dilatation.

Spleen: Normal in size, without focal abnormality.

Adrenals/Urinary Tract: Mild scarring involving the interpolar left
kidney laterally. Normal noncontrast appearance of the right kidney,
without hydronephrosis. No bladder calculi.

Stomach/Bowel: Normal stomach, without wall thickening. Normal small
bowel.

Vascular/Lymphatic: Aortic and branch vessel atherosclerosis. No
abdominopelvic adenopathy.

Reproductive: Normal prostate.

Other: No significant free fluid.

Musculoskeletal: No acute osseous abnormality.

1. No acute extracolonic findings within the abdomen or pelvis.
2.  Coronary artery atherosclerosis. Aortic atherosclerosis.

## 2017-05-15 ENCOUNTER — Ambulatory Visit (INDEPENDENT_AMBULATORY_CARE_PROVIDER_SITE_OTHER): Payer: PRIVATE HEALTH INSURANCE | Admitting: Physician Assistant

## 2017-05-15 ENCOUNTER — Telehealth: Payer: Self-pay | Admitting: Cardiovascular Disease

## 2017-05-15 VITALS — BP 124/88 | HR 65 | Ht 71.0 in | Wt 197.0 lb

## 2017-05-15 DIAGNOSIS — I10 Essential (primary) hypertension: Secondary | ICD-10-CM | POA: Diagnosis not present

## 2017-05-15 DIAGNOSIS — E785 Hyperlipidemia, unspecified: Secondary | ICD-10-CM

## 2017-05-15 DIAGNOSIS — E039 Hypothyroidism, unspecified: Secondary | ICD-10-CM | POA: Diagnosis not present

## 2017-05-15 DIAGNOSIS — R002 Palpitations: Secondary | ICD-10-CM

## 2017-05-15 DIAGNOSIS — I701 Atherosclerosis of renal artery: Secondary | ICD-10-CM | POA: Diagnosis not present

## 2017-05-15 MED ORDER — NEBIVOLOL HCL 5 MG PO TABS: 2.5000 mg | ORAL_TABLET | Freq: Two times a day (BID) | ORAL | 5 refills | Status: DC

## 2017-05-15 NOTE — Patient Instructions (Signed)
Medication Instructions:   INCREASE your bystolic to 2.5mg  TWICE DAILY  Monitor your heart rate at home. If symptoms resolve with medication changes let us know. If symptoms get worse or if you have dizziness, passing out spells, etc, please call us as well, as we will pursue a 30 day event monitor to further understand what your heart is doing.   Labwork:   None. We will contact your primary care physician's office to get last thyroid function results.  Testing/Procedures:  none  Follow-Up:  Your physician recommends you to follow up either with Dr. Gwenlyn Found or Almyra Deforest PA in 3 weeks  If you need a refill on your cardiac medications before your next appointment, please call your pharmacy.

## 2017-05-15 NOTE — Telephone Encounter (Signed)
Received call from patient.He stated he has been having frequent palpitations,would like to be seen.Appointment scheduled with Almyra Deforest PA this afternoon at 1:30 pm.

## 2017-05-15 NOTE — Telephone Encounter (Signed)
New message      Patient c/o Palpitations:  High priority if patient c/o lightheadedness and shortness of breath.  1. How long have you been having palpitations? Last couple of days  2. Are you currently experiencing lightheadedness and shortness of breath?  Little sob 3. Have you checked your BP and heart rate? (document readings)  Last night 140/83---dont know heart rate  4. Are you experiencing any other symptoms?  Maybe a little "heavy chested" flutter.  Feels like he needs to burp but it is not a burp

## 2017-05-15 NOTE — Progress Notes (Signed)
Cardiology Office Note    Date:  05/16/2017   ID:  Meilech, Virts 1955/12/23, MRN [id number]  PCP:  Sharilyn Sites, MD  Cardiologist:  Dr. Gwenlyn Found   Chief Complaint  Patient presents with  . Follow-up    seen for Dr. Gwenlyn Found.     History of Present Illness:  Daniel [name] is a 61 y.o. male with PMH of bilateral renal artery stenosis, hypertension, hyperlipidemia, hypothyroidism and palpitation. He was previously a patient of Dr. Terance Ice. He quit tobacco use many years ago, however had a history of 80 pack years of tobacco abuse. He had a negative Myoview stress test in November 2013. Echocardiogram obtained at the time showed EF 50-55%, otherwise no significant valvular issues. He had a event monitor in February 2016 shows normal sinus rhythm. Last renal artery ultrasound was obtained in early 2017 which showed left greater than right stenosis, it was recommended to follow-up on PRN basis. He had a history of palpitation, symptom improved after adjustment of his thyroid medication as well as limitation of caffeine and alcohol intake. He was last seen by Dr. Gwenlyn Found on 01/29/2017, he was doing well at the time. One year follow-up was recommended. Fasting lipid panel obtained on the same day showed HDL 36, LDL 97, cholesterol 162, triglyceride 146.  He presents today for evaluation of palpitation. He describes the palpitations as a single skipped heartbeat. When it occurs, he also feel short of breath for a moment, but no dizziness or presyncope. He has a history of PVCs, his presentation appears to be symptomatic PVCs. He denies any chest pain, he denies any shortness of breath otherwise. He has no lower extremity edema, orthopnea or PND. We discussed various options including an event monitor versus medical therapy. Patient eventually agreed to increase Bystolic to 2.5 mg twice a day dose, and monitor for a few weeks, if symptoms improve on the Bystolic, we'll hold off on additional  heart monitor.   Past Medical History:  Diagnosis Date  . Anxiety   . Bilateral renal artery stenosis ([place])   . History of tobacco abuse   . Hyperlipidemia   . Hypertension   . Hypothyroidism   . Palpitations   . Thyroid disease     Past Surgical History:  Procedure Laterality Date  . COLONOSCOPY N/A 07/11/2016   Procedure: COLONOSCOPY;  Surgeon: Rogene Houston, MD;  [facility];  Service: Endoscopy;  Laterality: N/A;  930  . MENISCUS REPAIR Left 05/29/13  . POLYPECTOMY  07/11/2016   Procedure: POLYPECTOMY;  Surgeon: Rogene Houston, MD;  [facility];  Service: Endoscopy;;  colon  . US ECHOCARDIOGRAPHY  07/18/2012    Current Medications: Outpatient Medications Prior to Visit  Medication Sig Dispense Refill  . escitalopram (LEXAPRO) 10 MG tablet Take 10 mg by mouth at bedtime.     Marland Kitchen ibuprofen (ADVIL,MOTRIN) 200 MG tablet Take 200-400 mg by mouth every 6 (six) hours as needed (For pain/arthritis.).    Marland Kitchen levothyroxine (SYNTHROID, LEVOTHROID) 125 MCG tablet Take 62.5-125 mcg by mouth as directed. 62.5 MCG (ONE-HALF TABLET) ON Thursday AND Friday, 125 MCG (ONE TABLET) ON ALL OTHER DAYS.    Marland Kitchen losartan-hydrochlorothiazide (HYZAAR) 50-12.5 MG tablet TAKE ONE-HALF TABLET BY MOUTH ONCE A DAY 15 tablet 12  . BYSTOLIC 5 MG tablet TAKE ONE-HALF TABLET BY MOUTH ONCE A DAY 15 tablet 12   No facility-administered medications prior to visit.      Allergies:   Patient  has no known allergies.   Social History   Social History  . Marital status: Married    Spouse name: N/A  . Number of children: N/A  . Years of education: N/A   Social History Main Topics  . Smoking status: Former Smoker    Packs/day: 2.00    Years: 40.00    Types: Cigarettes    Quit date: 12/16/2010  . Smokeless tobacco: Current User    Types: Snuff  . Alcohol use 14.0 oz/week    28 Standard drinks or equivalent per week     Comment: daily  . Drug use: No  . Sexual activity: Not Asked    Other Topics Concern  . None   Social History Narrative  . None     Family History:  The patient's family history includes CAD in his maternal grandmother; Cancer in his mother; Diabetes in his mother; Heart attack in his maternal grandfather; Hyperlipidemia in his father; Hypertension in his father and mother; Stroke in his father.   ROS:   Please see the history of present illness.    ROS All other systems reviewed and are negative.   PHYSICAL EXAM:   VS:  BP 124/88   Pulse 65   Ht 5\' 11"  (1.803 m)   Wt 197 lb (89.4 kg)   BMI 27.48 kg/m    GEN: Well nourished, well developed, in no acute distress  HEENT: normal  Neck: no JVD, carotid bruits, or masses Cardiac: RRR; no murmurs, rubs, or gallops,no edema  Respiratory:  clear to auscultation bilaterally, normal work of breathing GI: soft, nontender, nondistended, + BS MS: no deformity or atrophy  Skin: warm and dry, no rash Neuro:  Alert and Oriented x 3, Strength and sensation are intact Psych: euthymic mood, full affect  Wt Readings from Last 3 Encounters:  05/15/17 197 lb (89.4 kg)  01/29/17 202 lb (91.6 kg)  07/11/16 187 lb (84.8 kg)      Studies/Labs Reviewed:   EKG:  EKG is ordered today.  The ekg ordered today demonstrates Normal sinus rhythm without significant ST-T wave changes  Recent Labs: 02/04/2017: ALT 11   Lipid Panel    Component Value Date/Time   CHOL 162 02/04/2017 0827   TRIG 146 02/04/2017 0827   HDL 36 (L) 02/04/2017 0827   CHOLHDL 4.5 02/04/2017 0827   VLDL 29 02/04/2017 0827   LDLCALC 97 02/04/2017 0827    Additional studies/ records that were reviewed today include:   Myoview 07/18/2012    Echo 07/18/2012     Renal artery [place] 10/20/2015 Normal caliber abdominal aorta. Essentially stable 1-59% right renal artery stenosis.  Essentially stable >60% left renal artery stenosis.  Normal bilateral kidney size. The IVC and renal veins are patent.   ASSESSMENT:    1.  Palpitations   2. Essential hypertension   3. Hyperlipidemia, unspecified hyperlipidemia type   4. Hypothyroidism, unspecified type   5. Renal artery stenosis (HCC)      PLAN:  In order of problems listed above:  1. 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  2. Hypertension: Blood pressure stable  3. Hyperlipidemia: Currently not on statin medication, last lipid panel  HDL 36, LDL 97, cholesterol 162, triglyceride 146. Recommended diet and exercise  4. Hypothyroidism: On Synthroid  5. Renal artery stenosis: Last renal ultrasound February 2017, greater than 60% left renal artery stenosis, 1-59% right renal artery stenosis, follow-up PRN    Medication Adjustments/Labs  and Tests Ordered: Current medicines are reviewed at length with the patient today.  Concerns regarding medicines are outlined above.  Medication changes, Labs and Tests ordered today are listed in the Patient Instructions below. Patient Instructions  Medication Instructions:   INCREASE your bystolic to 2.5mg  TWICE DAILY  Monitor your heart rate at home. If symptoms resolve with medication changes let us know. If symptoms get worse or if you have dizziness, passing out spells, etc, please call us as well, as we will pursue a 30 day event monitor to further understand what your heart is doing.   Labwork:   None. We will contact your primary care physician's office to get last thyroid function results.  Testing/Procedures:  none  Follow-Up:  Your physician recommends you to follow up either with Dr. Gwenlyn Found or Almyra Deforest PA in 3 weeks  If you need a refill on your cardiac medications before your next appointment, please call your pharmacy.      Daniel [name], [place]  05/16/2017 10:18 PM    [facility] [address], [place], [place]  [address] Phone: [phone]; Fax: [phone]

## 2017-05-16 ENCOUNTER — Encounter: Payer: Self-pay | Admitting: Physician Assistant

## 2017-06-05 ENCOUNTER — Encounter: Payer: Self-pay | Admitting: Physician Assistant

## 2017-06-05 ENCOUNTER — Ambulatory Visit (INDEPENDENT_AMBULATORY_CARE_PROVIDER_SITE_OTHER): Payer: PRIVATE HEALTH INSURANCE | Admitting: Physician Assistant

## 2017-06-05 VITALS — BP 138/84 | HR 62 | Ht 71.0 in | Wt 201.0 lb

## 2017-06-05 DIAGNOSIS — I701 Atherosclerosis of renal artery: Secondary | ICD-10-CM | POA: Diagnosis not present

## 2017-06-05 DIAGNOSIS — R002 Palpitations: Secondary | ICD-10-CM

## 2017-06-05 DIAGNOSIS — E039 Hypothyroidism, unspecified: Secondary | ICD-10-CM

## 2017-06-05 DIAGNOSIS — E785 Hyperlipidemia, unspecified: Secondary | ICD-10-CM | POA: Diagnosis not present

## 2017-06-05 DIAGNOSIS — I10 Essential (primary) hypertension: Secondary | ICD-10-CM

## 2017-06-05 MED ORDER — NEBIVOLOL HCL 5 MG PO TABS: 5.0000 mg | ORAL_TABLET | Freq: Every day | ORAL | 11 refills | Status: DC

## 2017-06-05 NOTE — Progress Notes (Signed)
Cardiology Office Note    Date:  06/05/2017   ID:  Jamason, Peckham 03/17/1956, MRN [id number]  PCP:  Sharilyn Sites, MD  Cardiologist:  Dr. Gwenlyn Found   Chief Complaint  Patient presents with  . Follow-up    pt c/o palpitations last few days    History of Present Illness:  Daniel [name] is a 61 y.o. male with PMH of HTN, HLD, hypothyroidism, palpitation, bilateral renal artery stenosis (L>R) and h/o tobacco abuse.  He was previously a patient of Dr. Terance Ice. He quit tobacco use many years ago, however had a history of 80 pack years of tobacco abuse. He had a negative Myoview stress test in November 2013. Echocardiogram obtained at the time showed EF 50-55%, otherwise no significant valvular issues. He had a event monitor in February 2016 shows normal sinus rhythm. Last renal artery ultrasound was obtained in early 2017 which showed left greater than right stenosis, it was recommended to follow-up on PRN basis. He had a history of palpitation, symptom improved after adjustment of his thyroid medication as well as limitation of caffeine and alcohol intake. He was last seen by Dr. Gwenlyn Found on 01/29/2017, he was doing well at the time. One year follow-up was recommended. Fasting lipid panel obtained on the same day showed HDL 36, LDL 97, cholesterol 162, triglyceride 146.  He presented to cardiology office on 05/15/2017 with palpitation. He describes the palpitations as a single skipped heartbeat. When it occurs, he feels short of breath for movement however no dizziness or presyncope. We discussed various options including event monitor versus medical therapy. Eventually patient agreed to increase Bystolic to 2.5 mg twice a day. Patient presents today for cardiology office visit. He says he was doing well and had not had any palpation episode since the last visit until yesterday. He says he may have missed a few p.m. doses of Bystolic as he sometimes forget. Otherwise, he went for more than 2  weeks after our last visit without any palpitation episode. I will hold off on car monitor at this time, I will consolidate his Bystolic to 5 mg daily instead.   Past Medical History:  Diagnosis Date  . Anxiety   . Bilateral renal artery stenosis ([place])   . History of tobacco abuse   . Hyperlipidemia   . Hypertension   . Hypothyroidism   . Palpitations   . Thyroid disease     Past Surgical History:  Procedure Laterality Date  . COLONOSCOPY N/A 07/11/2016   Procedure: COLONOSCOPY;  Surgeon: Rogene Houston, MD;  [facility];  Service: Endoscopy;  Laterality: N/A;  930  . MENISCUS REPAIR Left 05/29/13  . POLYPECTOMY  07/11/2016   Procedure: POLYPECTOMY;  Surgeon: Rogene Houston, MD;  [facility];  Service: Endoscopy;;  colon  . US ECHOCARDIOGRAPHY  07/18/2012    Current Medications: Outpatient Medications Prior to Visit  Medication Sig Dispense Refill  . escitalopram (LEXAPRO) 10 MG tablet Take 10 mg by mouth at bedtime.     Marland Kitchen ibuprofen (ADVIL,MOTRIN) 200 MG tablet Take 200-400 mg by mouth every 6 (six) hours as needed (For pain/arthritis.).    Marland Kitchen levothyroxine (SYNTHROID, LEVOTHROID) 125 MCG tablet Take 62.5-125 mcg by mouth as directed. 62.5 MCG (ONE-HALF TABLET) ON Thursday AND Friday, 125 MCG (ONE TABLET) ON ALL OTHER DAYS.    Marland Kitchen losartan-hydrochlorothiazide (HYZAAR) 50-12.5 MG tablet TAKE ONE-HALF TABLET BY MOUTH ONCE A DAY 15 tablet 12  . tadalafil (CIALIS) 20  MG tablet Take 10 mg by mouth daily as needed for erectile dysfunction.    . nebivolol (BYSTOLIC) 5 MG tablet Take 0.5 tablets (2.5 mg total) by mouth 2 (two) times daily. 30 tablet 5   No facility-administered medications prior to visit.      Allergies:   Patient has no known allergies.   Social History   Social History  . Marital status: Married    Spouse name: N/A  . Number of children: N/A  . Years of education: N/A   Social History Main Topics  . Smoking status: Former Smoker     Packs/day: 2.00    Years: 40.00    Types: Cigarettes    Quit date: 12/16/2010  . Smokeless tobacco: Current User    Types: Snuff  . Alcohol use 14.0 oz/week    28 Standard drinks or equivalent per week     Comment: daily  . Drug use: No  . Sexual activity: Not Asked   Other Topics Concern  . None   Social History Narrative  . None     Family History:  The patient's family history includes CAD in his maternal grandmother; Cancer in his mother; Diabetes in his mother; Heart attack in his maternal grandfather; Hyperlipidemia in his father; Hypertension in his father and mother; Stroke in his father.   ROS:   Please see the history of present illness.    ROS All other systems reviewed and are negative.   PHYSICAL EXAM:   VS:  BP 138/84   Pulse 62   Ht 5\' 11"  (1.803 m)   Wt 201 lb (91.2 kg)   BMI 28.03 kg/m    GEN: Well nourished, well developed, in no acute distress  HEENT: normal  Neck: no JVD, carotid bruits, or masses Cardiac: RRR; no murmurs, rubs, or gallops,no edema  Respiratory:  clear to auscultation bilaterally, normal work of breathing GI: soft, nontender, nondistended, + BS MS: no deformity or atrophy  Skin: warm and dry, no rash Neuro:  Alert and Oriented x 3, Strength and sensation are intact Psych: euthymic mood, full affect  Wt Readings from Last 3 Encounters:  06/05/17 201 lb (91.2 kg)  05/15/17 197 lb (89.4 kg)  01/29/17 202 lb (91.6 kg)      Studies/Labs Reviewed:   EKG:  EKG is ordered today.  The ekg ordered today demonstrates Normal sinus rhythm, no significant ST-T wave changes.  Recent Labs: 02/04/2017: ALT 11   Lipid Panel    Component Value Date/Time   CHOL 162 02/04/2017 0827   TRIG 146 02/04/2017 0827   HDL 36 (L) 02/04/2017 0827   CHOLHDL 4.5 02/04/2017 0827   VLDL 29 02/04/2017 0827   LDLCALC 97 02/04/2017 0827    Additional studies/ records that were reviewed today include:   Echo 07/18/2012     Myoview  07/18/2012    Renal artery duplex 10/20/2015 Impressions Normal caliber abdominal aorta. Essentially stable 1-59% right renal artery stenosis.  Essentially stable >60% left renal artery stenosis.  Normal bilateral kidney size. The IVC and renal veins are patent.  ASSESSMENT:    1. Palpitations   2. Essential hypertension   3. Hyperlipidemia, unspecified hyperlipidemia type   4. Hypothyroidism, unspecified type   5. Renal artery stenosis (HCC)      PLAN:  In order of problems listed above:  1. Palpitation: Described as a single skipped heartbeat, after I switched him to Bystolic 2.5 mg twice a day, he is palpitation significantly reduced. It  restarted in the last 2 days after he missed several nighttime dose of Bystolic. I will consolidate Bystolic to 5 mg daily.  2. Hypertension: Blood pressure stable  3. Hyperlipidemia: Continue diet and exercise, last lipid panel showed HDL 36, LDL 97, total cholesterol 162, triglyceride 146. Currently not on a statin medication.  4. Hypothyroidism: On Synthroid  5. Renal artery stenosis: Last renal artery ultrasound was obtained in early 2017 which showed left greater than right stenosis, it was recommended to follow-up on PRN basis.     Medication Adjustments/Labs and Tests Ordered: Current medicines are reviewed at length with the patient today.  Concerns regarding medicines are outlined above.  Medication changes, Labs and Tests ordered today are listed in the Patient Instructions below. Patient Instructions  Medication Instructions: CHANGE how you are taking the Bystolic. Take one 5mg  tablet daily in the morning.  If you need a refill on your cardiac medications before your next appointment, please call your pharmacy.    Follow-Up: Your physician wants you to follow-up in: 2-3 months with Dr. Gwenlyn Found. You will receive a reminder letter in the mail two months in advance. If you don't receive a letter, please call our office to schedule  this follow-up appointment.   Thank you for choosing [facility] at [facility], [place]  06/05/2017 11:24 PM    [address], [place], [place]  [address] Phone: [phone]; Fax: [phone]

## 2017-06-05 NOTE — Patient Instructions (Signed)
Medication Instructions: CHANGE how you are taking the Bystolic. Take one 5mg  tablet daily in the morning.  If you need a refill on your cardiac medications before your next appointment, please call your pharmacy.    Follow-Up: Your physician wants you to follow-up in: 2-3 months with Dr. Gwenlyn Found. You will receive a reminder letter in the mail two months in advance. If you don't receive a letter, please call our office to schedule this follow-up appointment.   Thank you for choosing [facility] at [facility]!!

## 2017-08-20 ENCOUNTER — Encounter: Payer: Self-pay | Admitting: Cardiovascular Disease

## 2017-08-20 ENCOUNTER — Ambulatory Visit (INDEPENDENT_AMBULATORY_CARE_PROVIDER_SITE_OTHER): Payer: PRIVATE HEALTH INSURANCE | Admitting: Cardiovascular Disease

## 2017-08-20 DIAGNOSIS — E7800 Pure hypercholesterolemia, unspecified: Secondary | ICD-10-CM | POA: Diagnosis not present

## 2017-08-20 DIAGNOSIS — R002 Palpitations: Secondary | ICD-10-CM | POA: Diagnosis not present

## 2017-08-20 DIAGNOSIS — I10 Essential (primary) hypertension: Secondary | ICD-10-CM | POA: Diagnosis not present

## 2017-08-20 NOTE — Progress Notes (Signed)
08/20/2017 CINCH ORMOND   1956/03/08  [id number]  Primary Physician Sharilyn Sites, MD Primary Cardiologist: Lorretta Harp MD Lupe Carney, [place]  HPI:  Daniel [name] is a 61 y.o.  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 I last saw him 12/20/15.He was previously a patient of Dr. Terance Ice. I apparently took care of his father prior to his death. His cardiac risk factor profile is positive for 80 pack years of tobacco abuse having quit 7  years ago, treated hypertension and mild hyperlipidemia. He has never had a heart for stroke. He denies chest pain or shortness of breath. He had a negative Myoview stress test 07/18/12 a normal 2-D echo at that time. His major complaint is of palpitations. This has been evaluated in the past with an event monitor, 2-D echo and Myoview. 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 He does admit to excessive caffeine or alcohol intake. Also has hypothyroidism on thyroid replacement therapy. Apparently has values were elevated and his primary care physician is titrating his thyroid replacement. He does complain of bilateral leg; claudication with lower extremity Dopplers Were performed 01/03/16 which were entirely normal. Since I saw him a year ago he remained stable. He did see Almyra Deforest [facility] in the office complaining of increased palpitations and his beta blocker was titrated. His symptoms have markedly improved.    Current Meds  Medication Sig  . escitalopram (LEXAPRO) 10 MG tablet Take 10 mg by mouth at bedtime.   Marland Kitchen ibuprofen (ADVIL,MOTRIN) 200 MG tablet Take 200-400 mg by mouth every 6 (six) hours as needed (For pain/arthritis.).  Marland Kitchen levothyroxine (SYNTHROID, LEVOTHROID) 125 MCG tablet Take 62.5-125 mcg by mouth as directed. 62.5 MCG (ONE-HALF TABLET) ON Thursday AND Friday, 125 MCG (ONE TABLET) ON ALL OTHER DAYS.  Marland Kitchen losartan-hydrochlorothiazide (HYZAAR) 50-12.5 MG  tablet TAKE ONE-HALF TABLET BY MOUTH ONCE A DAY  . nebivolol (BYSTOLIC) 5 MG tablet Take 1 tablet (5 mg total) by mouth daily.  . tadalafil (CIALIS) 20 MG tablet Take 10 mg by mouth daily as needed for erectile dysfunction.  . Testosterone 30 MG MISC Place inside cheek.     No Known Allergies  Social History   Socioeconomic History  . Marital status: Married    Spouse name: Not on file  . Number of children: Not on file  . Years of education: Not on file  . Highest education level: Not on file  Social Needs  . Financial resource strain: Not on file  . Food insecurity - worry: Not on file  . Food insecurity - inability: Not on file  . Transportation needs - medical: Not on file  . Transportation needs - non-medical: Not on file  Occupational History  . Not on file  Tobacco Use  . Smoking status: Former Smoker    Packs/day: 2.00    Years: 40.00    Pack years: 80.00    Types: Cigarettes    Last attempt to quit: 12/16/2010    Years since quitting: 6.6  . Smokeless tobacco: Current User    Types: Snuff  Substance and Sexual Activity  . Alcohol use: Yes    Alcohol/week: 14.0 oz    Types: 28 Standard drinks or equivalent per week    Comment: daily  . Drug use: No  . Sexual activity: Not on file  Other Topics Concern  . Not on file  Social  History Narrative  . Not on file     Review of Systems: General: negative for chills, fever, night sweats or weight changes.  Cardiovascular: negative for chest pain, dyspnea on exertion, edema, orthopnea, palpitations, paroxysmal nocturnal dyspnea or shortness of breath Dermatological: negative for rash Respiratory: negative for cough or wheezing Urologic: negative for hematuria Abdominal: negative for nausea, vomiting, diarrhea, bright red blood per rectum, melena, or hematemesis Neurologic: negative for visual changes, syncope, or dizziness All other systems reviewed and are otherwise negative except as noted above.    Blood  pressure 110/70, pulse 60, height 5\' 11"  (1.803 m), weight 199 lb (90.3 kg).  General appearance: alert and no distress Neck: no adenopathy, no carotid bruit, no JVD, supple, symmetrical, trachea midline and thyroid not enlarged, symmetric, no tenderness/mass/nodules Lungs: clear to auscultation bilaterally Heart: regular rate and rhythm, S1, S2 normal, no murmur, click, rub or gallop Extremities: extremities normal, atraumatic, no cyanosis or edema Pulses: 2+ and symmetric Skin: Skin color, texture, turgor normal. No rashes or lesions Neurologic: Alert and oriented X 3, normal strength and tone. Normal symmetric reflexes. Normal coordination and gait  EKG not performed today  ASSESSMENT AND PLAN:   Hypertension History of essential hypertension blood pressure measured 110/70. He is on Bystolic , , losartan and hydrochlorothiazide. Continue current meds are current dosing.  Hyperlipidemia History of hyperlipidemia not on statin therapy with recent lipid profile performed by his PCP 02/04/17 related to cholesterol 62, LDL 97 HDL 36.  Palpitations History of palpitations demonstrated to be PVCs on monitoring improved with titration of his beta blocker and reduction in his triggers including caffeine and alcohol.      Lorretta Harp MD FACP,FACC,FAHA, [facility] 08/20/2017 10:44 AM

## 2017-08-20 NOTE — Assessment & Plan Note (Signed)
History of essential hypertension blood pressure measured 110/70. He is on Bystolic , , losartan and hydrochlorothiazide. Continue current meds are current dosing.

## 2017-08-20 NOTE — Assessment & Plan Note (Signed)
History of hyperlipidemia not on statin therapy with recent lipid profile performed by his PCP 02/04/17 related to cholesterol 62, LDL 97 HDL 36.

## 2017-08-20 NOTE — Patient Instructions (Signed)

## 2017-08-20 NOTE — Assessment & Plan Note (Signed)
History of palpitations demonstrated to be PVCs on monitoring improved with titration of his beta blocker and reduction in his triggers including caffeine and alcohol.

## 2018-04-02 ENCOUNTER — Other Ambulatory Visit: Payer: Self-pay | Admitting: Cardiovascular Disease

## 2018-04-02 NOTE — Telephone Encounter (Signed)
Rx request sent to pharmacy.  

## 2018-08-26 ENCOUNTER — Ambulatory Visit (INDEPENDENT_AMBULATORY_CARE_PROVIDER_SITE_OTHER): Payer: PRIVATE HEALTH INSURANCE | Admitting: Cardiovascular Disease

## 2018-08-26 ENCOUNTER — Encounter: Payer: Self-pay | Admitting: Cardiovascular Disease

## 2018-08-26 VITALS — BP 118/66 | HR 55 | Ht 71.0 in | Wt 201.0 lb

## 2018-08-26 DIAGNOSIS — E7800 Pure hypercholesterolemia, unspecified: Secondary | ICD-10-CM

## 2018-08-26 DIAGNOSIS — I10 Essential (primary) hypertension: Secondary | ICD-10-CM

## 2018-08-26 DIAGNOSIS — R002 Palpitations: Secondary | ICD-10-CM

## 2018-08-26 NOTE — Progress Notes (Signed)
08/26/2018 Daniel [name]   Jan 19, 1956  [id number]  Primary Physician Sharilyn Sites, MD Primary Cardiologist: Lorretta Harp MD Daniel [name], [place]  HPI:  Daniel [name] is a 62 y.o.  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 I last saw 08/20/2017.He was previously a patient of Dr. Terance Ice. I apparently took care of his father prior to his death. His cardiac risk factor profile is positive for 80 pack years of tobacco abuse having quit7years ago, treated hypertension and mild hyperlipidemia. He has never had a heart for stroke. He denies chest pain or shortness of breath. He had a negative Myoview stress test 07/18/12 a normal 2-D echo at that time. His major complaint is of palpitations. This has been evaluated in the past with an event monitor, 2-D echo and Myoview. 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 He does admit to excessive caffeine or alcohol intake. Also has hypothyroidism on thyroid replacement therapy. Apparently has values were elevated and his primary care physician is titrating his thyroid replacement. He does complain of bilateral leg; claudicationwith lower extremity Dopplers Were performed 01/03/16 which were entirely normal. Since I saw him a year ago he remained stable.  He was complaining of palpitations prior to my my office visit a year ago and saw Almyra Deforest PA-C who titrated his beta-blocker resulting in improvement in his symptoms.  Over the last year has been asymptomatic denying chest pain, shortness of breath or palpitations.  Current Meds  Medication Sig  . escitalopram (LEXAPRO) 10 MG tablet Take 10 mg by mouth at bedtime.   Marland Kitchen ibuprofen (ADVIL,MOTRIN) 200 MG tablet Take 200-400 mg by mouth every 6 (six) hours as needed (For pain/arthritis.).  Marland Kitchen levothyroxine (SYNTHROID, LEVOTHROID) 125 MCG tablet Take 62.5-125 mcg by mouth as directed. 62.5 MCG (ONE-HALF TABLET) ON  Thursday AND Friday, 125 MCG (ONE TABLET) ON ALL OTHER DAYS.  Marland Kitchen losartan-hydrochlorothiazide (HYZAAR) 50-12.5 MG tablet TAKE ONE-HALF TABLET BY MOUTH ONCE A DAY  . nebivolol (BYSTOLIC) 5 MG tablet Take 1 tablet (5 mg total) by mouth daily.  . tadalafil (CIALIS) 20 MG tablet Take 10 mg by mouth daily as needed for erectile dysfunction.  . Testosterone 30 MG MISC Place inside cheek.     No Known Allergies  Social History   Socioeconomic History  . Marital status: Married    Spouse name: Not on file  . Number of children: Not on file  . Years of education: Not on file  . Highest education level: Not on file  Occupational History  . Not on file  Social Needs  . Financial resource strain: Not on file  . Food insecurity:    Worry: Not on file    Inability: Not on file  . Transportation needs:    Medical: Not on file    Non-medical: Not on file  Tobacco Use  . Smoking status: Former Smoker    Packs/day: 2.00    Years: 40.00    Pack years: 80.00    Types: Cigarettes    Last attempt to quit: 12/16/2010    Years since quitting: 7.6  . Smokeless tobacco: Current User    Types: Snuff  Substance and Sexual Activity  . Alcohol use: Yes    Alcohol/week: 28.0 standard drinks    Types: 28 Standard drinks or equivalent per week    Comment: daily  . Drug use: No  .  Sexual activity: Not on file  Lifestyle  . Physical activity:    Days per week: Not on file    Minutes per session: Not on file  . Stress: Not on file  Relationships  . Social connections:    Talks on phone: Not on file    Gets together: Not on file    Attends religious service: Not on file    Active member of club or organization: Not on file    Attends meetings of clubs or organizations: Not on file    Relationship status: Not on file  . Intimate partner violence:    Fear of current or ex partner: Not on file    Emotionally abused: Not on file    Physically abused: Not on file    Forced sexual activity: Not on  file  Other Topics Concern  . Not on file  Social History Narrative  . Not on file     Review of Systems: General: negative for chills, fever, night sweats or weight changes.  Cardiovascular: negative for chest pain, dyspnea on exertion, edema, orthopnea, palpitations, paroxysmal nocturnal dyspnea or shortness of breath Dermatological: negative for rash Respiratory: negative for cough or wheezing Urologic: negative for hematuria Abdominal: negative for nausea, vomiting, diarrhea, bright red blood per rectum, melena, or hematemesis Neurologic: negative for visual changes, syncope, or dizziness All other systems reviewed and are otherwise negative except as noted above.    Blood pressure 118/66, pulse (!) 55, height 5\' 11"  (1.803 m), weight 201 lb (91.2 kg).  General appearance: alert and no distress Neck: no adenopathy, no carotid bruit, no JVD, supple, symmetrical, trachea midline and thyroid not enlarged, symmetric, no tenderness/mass/nodules Lungs: clear to auscultation bilaterally Heart: regular rate and rhythm, S1, S2 normal, no murmur, click, rub or gallop Extremities: extremities normal, atraumatic, no cyanosis or edema Pulses: 2+ and symmetric Skin: Skin color, texture, turgor normal. No rashes or lesions Neurologic: Alert and oriented X 3, normal strength and tone. Normal symmetric reflexes. Normal coordination and gait  EKG sinus bradycardia 55 with nonspecific ST and T wave changes. I  Personally reviewed this EKG.  ASSESSMENT AND PLAN:   Hypertension History of essential hypertension blood pressure measured today 118/66.  He is on Bystolic, losartan and hydrochlorothiazide.  Hyperlipidemia History of hyperlipidemia not on statin therapy with lipid profile performed 12/24/2017 revealing total cholesterol 166, LDL 104 and HDL 42.      Lorretta Harp MD FACP,FACC,FAHA, [facility] 08/26/2018 2:14 PM

## 2018-08-26 NOTE — Assessment & Plan Note (Signed)
History of essential hypertension blood pressure measured today 118/66.  He is on Bystolic, losartan and hydrochlorothiazide.

## 2018-08-26 NOTE — Patient Instructions (Signed)
Medication Instructions:  NO CHANGE If you need a refill on your cardiac medications before your next appointment, please call your pharmacy.   Lab work: NONE If you have labs (blood work) drawn today and your tests are completely normal, you will receive your results only by: Marland Kitchen MyChart Message (if you have MyChart) OR . A paper copy in the mail If you have any lab test that is abnormal or we need to change your treatment, we will call you to review the results.  Testing/Procedures: NONE  Follow-Up: At [facility], you and your health needs are our priority.  As part of our continuing mission to provide you with exceptional heart care, we have created designated Provider Care Teams.  These Care Teams include your primary Cardiologist (physician) and Advanced Practice Providers (APPs -  Physician Assistants and Nurse Practitioners) who all work together to provide you with the care you need, when you need it. You will need a follow up appointment in 12 months.  Please call our office 2 months in advance to schedule this appointment.  You may see DR Gwenlyn Found or one of the following Advanced Practice Providers on your designated Care Team:   Kerin Ransom, PA-C Roby Lofts, [place] . Sande Rives, PA-C

## 2018-08-26 NOTE — Assessment & Plan Note (Signed)
History of hyperlipidemia not on statin therapy with lipid profile performed 12/24/2017 revealing total cholesterol 166, LDL 104 and HDL 42.

## 2018-09-11 ENCOUNTER — Telehealth: Payer: Self-pay | Admitting: Cardiovascular Disease

## 2018-09-11 NOTE — Telephone Encounter (Signed)
Spoke to Tammy at [facility] losartan/hctz is on recall.    Patient takes losartan/hctz 50mg /12.5mg  1/2 tablet daily, so she cannot split them into 2 prescriptions.   Advised would send to pharmD to review and advise on changes.

## 2018-09-11 NOTE — Telephone Encounter (Signed)
Left message for patient to call back  

## 2018-09-11 NOTE — Telephone Encounter (Signed)
Not all losartan is on recall. Patient can try to get it from another pharmacy this month until her prefer pharmacy is able to get more.  OR we can change therapy to valsartan/HCTZ 80/112.5mg  to take 1/2 tablet daily. This change will require daily BP monitoring for 2 weeks and potential adjustment.

## 2018-09-12 MED ORDER — VALSARTAN-HYDROCHLOROTHIAZIDE 80-12.5 MG PO TABS: 0.5000 | ORAL_TABLET | Freq: Every day | ORAL | 3 refills | Status: DC

## 2018-09-12 NOTE — Telephone Encounter (Signed)
Returned call to pharmacy-advised of changes recommended.  New rx sent to pharmacy.

## 2018-09-12 NOTE — Telephone Encounter (Signed)
Pharmacy called again for a new script. Daniel [name] stated he doubts any pharmacy would have it as it has been recalled he can be reached [phone].

## 2019-02-02 ENCOUNTER — Other Ambulatory Visit: Payer: Self-pay | Admitting: Cardiovascular Disease

## 2019-02-03 ENCOUNTER — Telehealth: Payer: Self-pay | Admitting: Cardiology

## 2019-02-03 NOTE — Telephone Encounter (Signed)
New Message  Pt c/o medication issue:  1. Name of Medication: Bystolic  2. How are you currently taking this medication (dosage and times per day)? 1 tablet by mouth  3. Are you having a reaction (difficulty breathing--STAT)?NO  4. What is your medication issue?  Pharmacy faxing over Preauthorization form for patients Bystolic medication.

## 2019-02-11 ENCOUNTER — Telehealth: Payer: Self-pay | Admitting: Cardiology

## 2019-02-11 NOTE — Telephone Encounter (Signed)
Mychart sent via text, smartphone, consent (verbal), pre reg complete 02/11/19 AF

## 2019-02-12 ENCOUNTER — Telehealth (INDEPENDENT_AMBULATORY_CARE_PROVIDER_SITE_OTHER): Payer: Self-pay | Admitting: General Practice

## 2019-02-12 ENCOUNTER — Telehealth: Payer: Self-pay

## 2019-02-12 VITALS — BP 128/74 | HR 59 | Ht 71.0 in | Wt 200.0 lb

## 2019-02-12 DIAGNOSIS — R002 Palpitations: Secondary | ICD-10-CM

## 2019-02-12 DIAGNOSIS — I10 Essential (primary) hypertension: Secondary | ICD-10-CM

## 2019-02-12 DIAGNOSIS — E7800 Pure hypercholesterolemia, unspecified: Secondary | ICD-10-CM

## 2019-02-12 NOTE — Telephone Encounter (Signed)
Called patient to discuss AVS instructions; gave [name]'s recommendations and patient voiced understanding. AVS summary mailed to patient.    

## 2019-02-12 NOTE — Patient Instructions (Signed)
Medication Instructions:  Your physician recommends that you continue on your current medications as directed. Please refer to the Current Medication list given to you today.  If you need a refill on your cardiac medications before your next appointment, please call your pharmacy.   Lab work: None  If you have labs (blood work) drawn today and your tests are completely normal, you will receive your results only by: Marland Kitchen MyChart Message (if you have MyChart) OR . A paper copy in the mail If you have any lab test that is abnormal or we need to change your treatment, we will call you to review the results.  Testing/Procedures: None   Follow-Up: At [facility], you and your health needs are our priority.  As part of our continuing mission to provide you with exceptional heart care, we have created designated Provider Care Teams.  These Care Teams include your primary Cardiologist (physician) and Advanced Practice Providers (APPs -  Physician Assistants and Nurse Practitioners) who all work together to provide you with the care you need, when you need it. Marland Kitchen NEEDS APPT TOMORROW IN [facility] CL[facility]   Any Other Special Instructions Will Be Listed Below (If Applicable).

## 2019-02-12 NOTE — Progress Notes (Signed)
Virtual Visit via Video Note   This visit type was conducted due to national recommendations for restrictions regarding the COVID-19 Pandemic (e.g. social distancing) in an effort to limit this patient's exposure and mitigate transmission in our community.  Due to his co-morbid illnesses, this patient is at least at moderate risk for complications without adequate follow up.  This format is felt to be most appropriate for this patient at this time.  All issues noted in this document were discussed and addressed.  A limited physical exam was performed with this format.  Please refer to the patient's chart for his consent to telehealth for [facility].  Evaluation Performed:  Follow-up visit  This visit type was conducted due to national recommendations for restrictions regarding the COVID-19 Pandemic (e.g. social distancing).  This format is felt to be most appropriate for this patient at this time.  All issues noted in this document were discussed and addressed.  No physical exam was performed (except for noted visual exam findings with Video Visits).  Please refer to the patient's chart (MyChart message for video visits and phone note for telephone visits) for the patient's consent to telehealth for [facility]  Date:  02/12/2019   ID:  Daniel [name], DOB Oct 24, 1955, MRN [id number]  Patient Location:  [place] [address]   Provider location:   [facility] [address] [place], [place] [address]  PCP:  Sharilyn Sites, MD  Cardiologist:  Dr. Quay Burow, MD Electrophysiologist:  None   Chief Complaint:  Palpitations  History of Present Illness:    Daniel [name] is a 63 y.o. male who presents via audio/video conferencing for a telehealth visit today.  Patient verified DOB and address.   He has a PMH of essential hypertension, bilateral renal artery stenosis, hyperlipidemia, hypothyroid, tobacco abuse and palpitations.  He had a negative Myoview stress test on  07/2012 and also an 2013 had an echocardiogram that showed LVEF 50 to 55% and was otherwise unremarkable.  In February 2016 he was placed on an event monitor that showed normal sinus rhythm.  He has previously been seen by Dr. Gwenlyn Found and was also seen by Almyra Deforest for palpitations on 05/15/2017.  During this time he described palpitations as single skipped heartbeats.  During that visit his Bystolic was increased from 2.5 mg to 5 mg.  His EKG from December 2019 showed sinus bradycardia.  Today today via video visit patient endorsed palpitations that were accompanied by substernal chest pressure.  He stated it felt like his heart was skipping beats and that he also needed to belch.  He denied chest pain, shortness of breath, radiating pain in his arm or jaw, dizziness, syncope, orthopnea and PND.  He states he has had the heart palpitations  for about a week and they last 2 to 3 minutes at a time.  He states that today he feels much better and has not noticed any palpitations but is still concerned.  The patient does not have symptoms concerning for COVID-19 infection (fever, chills, cough, or new SHORTNESS OF BREATH).  He has been social distancing and wearing a mask when needed.   Prior CV studies:   The following studies were reviewed today:  Echocardiogram 07/18/2012  EF 50 to 55% and otherwise unremarkable  EKG 08/26/2018 Sinus bradycardia Past Medical History:  Diagnosis Date  . Anxiety   . Bilateral renal artery stenosis ([place])   . History of tobacco abuse   . Hyperlipidemia   .  Hypertension   . Hypothyroidism   . Palpitations   . Thyroid disease    Past Surgical History:  Procedure Laterality Date  . COLONOSCOPY N/A 07/11/2016   Procedure: COLONOSCOPY;  Surgeon: Rogene Houston, MD;  Location: [facility] ENDO SUITE;  Service: Endoscopy;  Laterality: N/A;  930  . MENISCUS REPAIR Left 05/29/13  . POLYPECTOMY  07/11/2016   Procedure: POLYPECTOMY;  Surgeon: Rogene Houston, MD;  Location: [facility]  ENDO SUITE;  Service: Endoscopy;;  colon  . US ECHOCARDIOGRAPHY  07/18/2012     Current Meds  Medication Sig  . BYSTOLIC 5 MG tablet TAKE ONE TABLET BY MOUTH DAILY.  Marland Kitchen escitalopram (LEXAPRO) 10 MG tablet Take 10 mg by mouth at bedtime.   Marland Kitchen ibuprofen (ADVIL,MOTRIN) 200 MG tablet Take 200-400 mg by mouth every 6 (six) hours as needed (For pain/arthritis.).  Marland Kitchen levothyroxine (SYNTHROID, LEVOTHROID) 125 MCG tablet Take 62.5-125 mcg by mouth as directed. 62.5 MCG (ONE-HALF TABLET) ON Thursday AND Friday, 125 MCG (ONE TABLET) ON ALL OTHER DAYS.  Marland Kitchen tadalafil (CIALIS) 20 MG tablet Take 10 mg by mouth daily as needed for erectile dysfunction.  . Testosterone 30 MG MISC Place inside cheek.  . valsartan-hydrochlorothiazide (DIOVAN HCT) 80-12.5 MG tablet Take 0.5 tablets by mouth daily.     Allergies:   Patient has no known allergies.   Social History   Tobacco Use  . Smoking status: Former Smoker    Packs/day: 2.00    Years: 40.00    Pack years: 80.00    Types: Cigarettes    Last attempt to quit: 12/16/2010    Years since quitting: 8.1  . Smokeless tobacco: Current User    Types: Snuff  Substance Use Topics  . Alcohol use: Yes    Alcohol/week: 28.0 standard drinks    Types: 28 Standard drinks or equivalent per week    Comment: daily  . Drug use: No     Family Hx: The patient's family history includes CAD in his maternal grandmother; Cancer in his mother; Diabetes in his mother; Heart attack in his maternal grandfather; Hyperlipidemia in his father; Hypertension in his father and mother; Stroke in his father.  ROS:   Please see the history of present illness.     All other systems reviewed and are negative.   Labs/Other Tests and Data Reviewed:    Recent Labs: No results found for requested labs within last 8760 hours.   Recent Lipid Panel Lab Results  Component Value Date/Time   CHOL 162 02/04/2017 08:27 AM   TRIG 146 02/04/2017 08:27 AM   HDL 36 (L) 02/04/2017 08:27 AM    CHOLHDL 4.5 02/04/2017 08:27 AM   LDLCALC 97 02/04/2017 08:27 AM    Wt Readings from Last 3 Encounters:  02/12/19 200 lb (90.7 kg)  08/26/18 201 lb (91.2 kg)  08/20/17 199 lb (90.3 kg)     Exam:    Vital Signs:  BP 128/74   Pulse (!) 59   Ht 5\' 11"  (1.803 m)   Wt 200 lb (90.7 kg)   BMI 27.89 kg/m    Well nourished, well developed male in no  acute distress.   ASSESSMENT & PLAN:    1.  Palpitations Add 81 mg aspirin Office visit and EKG 6/96/2952 Continue Bystolic 5 mg daily  2.  Essential hypertension Continue Diovan 80-12. 5 mg daily Monitor home blood pressures  3.  Pure hypercholesterolemia Obtain lipid panel and BMP from PCP   COVID-19 Education: The signs and  symptoms of COVID-19 were discussed with the patient and how to seek care for testing (follow up with PCP or arrange E-visit).  The importance of social distancing was discussed today.  Patient Risk:   After full review of this patients clinical status, I feel that they are at least moderate risk at this time.  Time:   Today, I have spent 15 minutes with the patient with telehealth technology discussing palpitations, hypertension, blood work.     Medication Adjustments/Labs and Tests Ordered: Current medicines are reviewed at length with the patient today.  Concerns regarding medicines are outlined above.   Tests Ordered: No orders of the defined types were placed in this encounter.  Medication Changes: No orders of the defined types were placed in this encounter.   Disposition:  in 1 day(s) with APP in office  Signed, Deberah Pelton, NP  02/12/2019 3:11 PM    [facility]

## 2019-02-13 ENCOUNTER — Other Ambulatory Visit: Payer: Self-pay

## 2019-02-13 ENCOUNTER — Encounter: Payer: Self-pay | Admitting: General Practice

## 2019-02-13 ENCOUNTER — Ambulatory Visit (INDEPENDENT_AMBULATORY_CARE_PROVIDER_SITE_OTHER): Payer: Self-pay | Admitting: General Practice

## 2019-02-13 VITALS — BP 134/84 | HR 55 | Temp 98.2°F | Ht 71.0 in | Wt 201.0 lb

## 2019-02-13 DIAGNOSIS — E7800 Pure hypercholesterolemia, unspecified: Secondary | ICD-10-CM

## 2019-02-13 DIAGNOSIS — R002 Palpitations: Secondary | ICD-10-CM

## 2019-02-13 DIAGNOSIS — I10 Essential (primary) hypertension: Secondary | ICD-10-CM

## 2019-02-13 MED ORDER — NEBIVOLOL HCL 5 MG PO TABS: 5.0000 mg | ORAL_TABLET | Freq: Every morning | ORAL | 6 refills | Status: DC

## 2019-02-13 NOTE — Progress Notes (Signed)
Cardiology Clinic Note   Patient Name: Daniel [name] Date of Encounter: 02/13/2019  Primary Care Provider:  Sharilyn Sites, MD Primary Cardiologist:  No primary care provider on file.  Patient Profile    Palpitations  Past Medical History    Past Medical History:  Diagnosis Date  . Anxiety   . Bilateral renal artery stenosis ([place])   . History of tobacco abuse   . Hyperlipidemia   . Hypertension   . Hypothyroidism   . Palpitations   . Thyroid disease    Past Surgical History:  Procedure Laterality Date  . COLONOSCOPY N/A 07/11/2016   Procedure: COLONOSCOPY;  Surgeon: Rogene Houston, MD;  Location: [facility];  Service: Endoscopy;  Laterality: N/A;  930  . MENISCUS REPAIR Left 05/29/13  . POLYPECTOMY  07/11/2016   Procedure: POLYPECTOMY;  Surgeon: Rogene Houston, MD;  Location: [facility];  Service: Endoscopy;;  colon  . US ECHOCARDIOGRAPHY  07/18/2012    Allergies  No Known Allergies  History of Present Illness    Daniel [name] is a 63 y.o. male who presents for an office visit today after being seen virtually yesterday to have an EKG and physical exam. He endorses palpitations that have increased in frequency over the last month which are lasting  2 to 3 minutes at a time.  He has noticed that palpitations start midday but, dissipate after he takes his Bystolic medication in the afternoon.  He has had increased stress at work related to COVID-19.  He also states he has been taking expired Bystolic that was given to him by a family member.  Presently he feels well.  Has not had palpitations for 3 to 5 days does not have chest pain, shortness of breath, dizziness, syncope, lower extremity swelling, orthopnea, or PND.  He has a PMH of essential hypertension, bilateral renal artery stenosis, hyperlipidemia, hypothyroid, tobacco abuse and palpitations.  He had a negative Myoview stress test on 07/2012 and also an 2013 had an echocardiogram that showed LVEF 50 to  55% and was otherwise unremarkable.  In February 2016 he was placed on an event monitor that showed normal sinus rhythm.  He has previously been seen by Dr. Gwenlyn Found and was also seen by Almyra Deforest for palpitations on 05/15/2017.  During this time he described palpitations as single skipped heartbeats.  During that visit his Bystolic was increased from 2.5 mg to 5 mg.  His EKG from December 2019 showed sinus bradycardia.   Home Medications    Prior to Admission medications   Medication Sig Start Date End Date Taking? Authorizing Provider  BYSTOLIC 5 MG tablet TAKE ONE TABLET BY MOUTH DAILY. 02/03/19  Yes Lorretta Harp, MD  escitalopram (LEXAPRO) 10 MG tablet Take 10 mg by mouth at bedtime.    Yes [provider]  ibuprofen (ADVIL,MOTRIN) 200 MG tablet Take 200-400 mg by mouth every 6 (six) hours as needed (For pain/arthritis.).   Yes [provider]  levothyroxine (SYNTHROID, LEVOTHROID) 125 MCG tablet Take 62.5-125 mcg by mouth as directed. 62.5 MCG (ONE-HALF TABLET) ON Thursday AND Friday, 125 MCG (ONE TABLET) ON ALL OTHER DAYS.   Yes [provider]  valsartan-hydrochlorothiazide (DIOVAN HCT) 80-12.5 MG tablet Take 0.5 tablets by mouth daily. 09/12/18  Yes Lorretta Harp, MD  Testosterone 30 MG MISC Place inside cheek.    [provider]    Family History    Family History  Problem Relation Age of Onset  .  Hyperlipidemia Father   . Hypertension Father   . Stroke Father   . CAD Maternal Grandmother   . Heart attack Maternal Grandfather   . Cancer Mother   . Diabetes Mother   . Hypertension Mother    He indicated that the status of his mother is unknown. He indicated that his father is deceased. He indicated that his sister is alive. He indicated that his maternal grandmother is deceased. He indicated that his maternal grandfather is deceased. He indicated that his paternal grandmother is deceased. He indicated that his paternal grandfather is  deceased.  Social History    Social History   Socioeconomic History  . Marital status: Married    Spouse name: Not on file  . Number of children: Not on file  . Years of education: Not on file  . Highest education level: Not on file  Occupational History  . Not on file  Social Needs  . Financial resource strain: Not on file  . Food insecurity:    Worry: Not on file    Inability: Not on file  . Transportation needs:    Medical: Not on file    Non-medical: Not on file  Tobacco Use  . Smoking status: Former Smoker    Packs/day: 2.00    Years: 40.00    Pack years: 80.00    Types: Cigarettes    Last attempt to quit: 12/16/2010    Years since quitting: 8.1  . Smokeless tobacco: Current User    Types: Snuff  Substance and Sexual Activity  . Alcohol use: Yes    Alcohol/week: 28.0 standard drinks    Types: 28 Standard drinks or equivalent per week    Comment: daily  . Drug use: No  . Sexual activity: Not on file  Lifestyle  . Physical activity:    Days per week: Not on file    Minutes per session: Not on file  . Stress: Not on file  Relationships  . Social connections:    Talks on phone: Not on file    Gets together: Not on file    Attends religious service: Not on file    Active member of club or organization: Not on file    Attends meetings of clubs or organizations: Not on file    Relationship status: Not on file  . Intimate partner violence:    Fear of current or ex partner: Not on file    Emotionally abused: Not on file    Physically abused: Not on file    Forced sexual activity: Not on file  Other Topics Concern  . Not on file  Social History Narrative  . Not on file     Review of Systems    General:  No chills, fever, night sweats or weight changes.  Cardiovascular:  No chest pain, dyspnea on exertion, edema, orthopnea, palpitations, paroxysmal nocturnal dyspnea. Dermatological: No rash, lesions/masses Respiratory: No cough, dyspnea Urologic: No  hematuria, dysuria Abdominal:   No nausea, vomiting, diarrhea, bright red blood per rectum, melena, or hematemesis Neurologic:  No visual changes, wkns, changes in mental status. All other systems reviewed and are otherwise negative except as noted above.  Physical Exam    VS:  Temp 98.2 F (36.8 C)   Ht 5\' 11"  (1.803 m)   Wt 201 lb (91.2 kg)   BMI 28.03 kg/m  , BMI Body mass index is 28.03 kg/m. GEN: Well nourished, well developed, in no acute distress. HEENT: normal. Neck: Supple, no JVD,  carotid bruits, or masses. Cardiac: RRR, no murmurs, rubs, or gallops. No clubbing, cyanosis, edema.  Radials/DP/PT 2+ and equal bilaterally.  Respiratory:  Respirations regular and unlabored, clear to auscultation bilaterally. GI: Soft, nontender, nondistended, BS + x 4. MS: no deformity or atrophy. Skin: warm and dry, no rash. Neuro:  Strength and sensation are intact. Psych: Normal affect.  Accessory Clinical Findings    ECG personally reviewed by me today-sinus bradycardia- No acute changes  Assessment & Plan   1.  Palpitations Change dosing time to a.m. Bystolic 5 mg daily Obtain a new prescription for Bystolic and stop using old prescription. Reduce stress Limit caffeinated beverages and chocolate.  2.  Essential hypertension Continue Diovan 80-12. 5 mg daily Monitor home blood pressures Begin exercise regimen with walking Practice stress reduction  3.  Pure hypercholesterolemia Obtain lipid panel and BMP from PCP  Follow-up with Dr. Gwenlyn Found in 3 to 4 months or before if needed.  Deberah Pelton, NP 02/13/2019, 11:23 AM

## 2019-02-13 NOTE — Patient Instructions (Signed)
Medication Instructions:  TAKE your Bystolic every morning  If you need a refill on your cardiac medications before your next appointment, please call your pharmacy.   Lab work: None  If you have labs (blood work) drawn today and your tests are completely normal, you will receive your results only by: Marland Kitchen MyChart Message (if you have MyChart) OR . A paper copy in the mail If you have any lab test that is abnormal or we need to change your treatment, we will call you to review the results.  Testing/Procedures: None   Follow-Up: At [facility], you and your health needs are our priority.  As part of our continuing mission to provide you with exceptional heart care, we have created designated Provider Care Teams.  These Care Teams include your primary Cardiologist (physician) and Advanced Practice Providers (APPs -  Physician Assistants and Nurse Practitioners) who all work together to provide you with the care you need, when you need it. You will need a follow up appointment in 4 months.  Please call our office 2 months in advance to schedule this appointment.  You may see Dr Quay Burow or one of the following Advanced Practice Providers on your designated Care Team:   Kerin Ransom, PA-C Roby Lofts, [place] . Sande Rives, PA-C  Any Other Special Instructions Will Be Listed Below (If Applicable).     Exercising to Stay Healthy To become healthy and stay healthy, it is recommended that you do moderate-intensity and vigorous-intensity exercise. You can tell that you are exercising at a moderate intensity if your heart starts beating faster and you start breathing faster but can still hold a conversation. You can tell that you are exercising at a vigorous intensity if you are breathing much harder and faster and cannot hold a conversation while exercising. Exercising regularly is important. It has many health benefits, such as:  Improving overall fitness, flexibility, and endurance.   Increasing bone density.  Helping with weight control.  Decreasing body fat.  Increasing muscle strength.  Reducing stress and tension.  Improving overall health. How often should I exercise? Choose an activity that you enjoy, and set realistic goals. Your health care provider can help you make an activity plan that works for you. Exercise regularly as told by your health care provider. This may include:  Doing strength training two times a week, such as: ? Lifting weights. ? Using resistance bands. ? Push-ups. ? Sit-ups. ? Yoga.  Doing a certain intensity of exercise for a given amount of time. Choose from these options: ? A total of 150 minutes of moderate-intensity exercise every week. ? A total of 75 minutes of vigorous-intensity exercise every week. ? A mix of moderate-intensity and vigorous-intensity exercise every week. Children, pregnant women, people who have not exercised regularly, people who are overweight, and older adults may need to talk with a health care provider about what activities are safe to do. If you have a medical condition, be sure to talk with your health care provider before you start a new exercise program. What are some exercise ideas? Moderate-intensity exercise ideas include:  Walking 1 mile (1.6 km) in about 15 minutes.  Biking.  Hiking.  Golfing.  Dancing.  Water aerobics. Vigorous-intensity exercise ideas include:  Walking 4.5 miles (7.2 km) or more in about 1 hour.  Jogging or running 5 miles (8 km) in about 1 hour.  Biking 10 miles (16.1 km) or more in about 1 hour.  Lap swimming.  Roller-skating or  in-line skating.  Cross-country skiing.  Vigorous competitive sports, such as football, basketball, and soccer.  Jumping rope.  Aerobic dancing. What are some everyday activities that can help me to get exercise?  [place] work, such as: ? Pushing a Conservation officer, nature. ? Raking and bagging leaves.  Washing your car.  Pushing a  stroller.  Shoveling snow.  Gardening.  Washing windows or floors. How can I be more active in my day-to-day activities?  Use stairs instead of an elevator.  Take a walk during your lunch break.  If you drive, park your car farther away from your work or school.  If you take public transportation, get off one stop early and walk the rest of the way.  Stand up or walk around during all of your indoor phone calls.  Get up, stretch, and walk around every 30 minutes throughout the day.  Enjoy exercise with a friend. Support to continue exercising will help you keep a regular routine of activity. What guidelines can I follow while exercising?  Before you start a new exercise program, talk with your health care provider.  Do not exercise so much that you hurt yourself, feel dizzy, or get very short of breath.  Wear comfortable clothes and wear shoes with good support.  Drink plenty of water while you exercise to prevent dehydration or heat stroke.  Work out until your breathing and your heartbeat get faster. Where to find more information  U.S. Department of Health and Human Services: [employer].at  Centers for Disease Control and Prevention (CDC): http://www.wolf.info/ Summary  Exercising regularly is important. It will improve your overall fitness, flexibility, and endurance.  Regular exercise also will improve your overall health. It can help you control your weight, reduce stress, and improve your bone density.  Do not exercise so much that you hurt yourself, feel dizzy, or get very short of breath.  Before you start a new exercise program, talk with your health care provider. This information is not intended to replace advice given to you by your health care provider. Make sure you discuss any questions you have with your health care provider. Document Released: 10/06/2010 Document Revised: 07/25/2017 Document Reviewed: 07/25/2017 Elsevier Interactive Patient Education  2019  [employer].

## 2019-02-16 NOTE — Addendum Note (Signed)
Addended by: Diana Eves on: 02/16/2019 06:35 PM   Modules accepted: Orders

## 2019-02-19 ENCOUNTER — Telehealth: Payer: Self-pay

## 2019-02-19 NOTE — Telephone Encounter (Signed)
Called patient to inform him that his prior authorization for Bystolic was denied due to not having insurance. Patient had new insurance so I took his insurance information over the phone and resubmitted the prior authorization with the new insurance information.

## 2019-02-24 ENCOUNTER — Other Ambulatory Visit: Payer: Self-pay

## 2019-06-03 ENCOUNTER — Other Ambulatory Visit: Payer: Self-pay

## 2019-06-03 ENCOUNTER — Ambulatory Visit (INDEPENDENT_AMBULATORY_CARE_PROVIDER_SITE_OTHER): Payer: Self-pay | Admitting: Cardiovascular Disease

## 2019-06-03 ENCOUNTER — Encounter: Payer: Self-pay | Admitting: Cardiovascular Disease

## 2019-06-03 DIAGNOSIS — R002 Palpitations: Secondary | ICD-10-CM

## 2019-06-03 DIAGNOSIS — I10 Essential (primary) hypertension: Secondary | ICD-10-CM

## 2019-06-03 DIAGNOSIS — E782 Mixed hyperlipidemia: Secondary | ICD-10-CM

## 2019-06-03 DIAGNOSIS — R079 Chest pain, unspecified: Secondary | ICD-10-CM

## 2019-06-03 DIAGNOSIS — I701 Atherosclerosis of renal artery: Secondary | ICD-10-CM

## 2019-06-03 LAB — BASIC METABOLIC PANEL
BUN/Creatinine Ratio: 16 (ref 10–24)
BUN: 15 mg/dL (ref 8–27)
CO2: 27 mmol/L (ref 20–29)
Calcium: 9.5 mg/dL (ref 8.6–10.2)
Chloride: 98 mmol/L (ref 96–106)
Creatinine, Ser: 0.96 mg/dL (ref 0.76–1.27)
GFR calc Af Amer: 96 mL/min/{1.73_m2} (ref >59)
GFR calc non Af Amer: 83 mL/min/{1.73_m2} (ref >59)
Glucose: 85 mg/dL (ref 65–99)
Potassium: 4.3 mmol/L (ref 3.5–5.2)
Sodium: 140 mmol/L (ref 134–144)

## 2019-06-03 LAB — CBC
Hematocrit: 41 % (ref 37.5–51.0)
Hemoglobin: 13.9 g/dL (ref 13.0–17.7)
MCH: 31.4 pg (ref 26.6–33.0)
MCHC: 33.9 g/dL (ref 31.5–35.7)
MCV: 93 fL (ref 79–97)
Platelets: 244 10*3/uL (ref 150–450)
RBC: 4.42 x10E6/uL (ref 4.14–5.80)
RDW: 12.7 % (ref 11.6–15.4)
WBC: 5.4 10*3/uL (ref 3.4–10.8)

## 2019-06-03 MED ORDER — METOPROLOL TARTRATE 100 MG PO TABS: 100.0000 mg | ORAL_TABLET | Freq: Once | ORAL | 0 refills | Status: DC

## 2019-06-03 NOTE — Addendum Note (Signed)
Addended by: Annita Brod on: 06/03/2019 11:43 AM   Modules accepted: Orders

## 2019-06-03 NOTE — Assessment & Plan Note (Signed)
History of essential hypertension with blood pressure measured today 130/72.  He is on Bystolic, Diovan and hydrochlorothiazide.

## 2019-06-03 NOTE — Progress Notes (Signed)
06/03/2019 Daniel [name]   02-Jan-1956  [id number]  Primary Physician Sharilyn Sites, MD Primary Cardiologist: Lorretta Harp MD Daniel [name], Daniel [name]  HPI:  Daniel [name] is a 63 y.o.  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 I last saw  08/26/2018.He was previously a patient of Dr. Terance Ice. I apparently took care of his father prior to his death. His cardiac risk factor profile is positive for 80 pack years of tobacco abuse having quit7years ago, treated hypertension and mild hyperlipidemia. He has never had a heart for stroke. He denies chest pain or shortness of breath. He had a negative Myoview stress test 07/18/12 a normal 2-D echo at that time. His major complaint is of palpitations. This has been evaluated in the past with an event monitor, 2-D echo and Myoview. 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 He does admit to excessive caffeine or alcohol intake. Also has hypothyroidism on thyroid replacement therapy. Apparently has values were elevated and his primary care physician is titrating his thyroid replacement. He does complain of bilateral leg; claudicationwith lower extremity Dopplers Were performed 01/03/16 which were entirely normal. Since I saw him a year ago he remained stable.  He was complaining of palpitations prior to my my office visit a year ago and saw Almyra Deforest PA-C who titrated his beta-blocker resulting in improvement in his symptoms.    Since I saw him a year ago he has done well.  His palpitations have improved/resolved with low-dose beta-blocker therapy.  Over the last 4 to 6 weeks he is noted bilateral upper extremity discomfort occurring randomly without chest pain.  Current Meds  Medication Sig  . escitalopram (LEXAPRO) 10 MG tablet Take 10 mg by mouth at bedtime.   Marland Kitchen ibuprofen (ADVIL,MOTRIN) 200 MG tablet Take 200-400 mg by mouth every 6 (six) hours as needed (For  pain/arthritis.).  Marland Kitchen levothyroxine (SYNTHROID, LEVOTHROID) 125 MCG tablet Take 62.5-125 mcg by mouth as directed. 62.5 MCG (ONE-HALF TABLET) ON Thursday AND Friday, 125 MCG (ONE TABLET) ON ALL OTHER DAYS.  Marland Kitchen nebivolol (BYSTOLIC) 5 MG tablet Take 1 tablet (5 mg total) by mouth every morning.  . Testosterone 30 MG MISC Place inside cheek.  . valsartan-hydrochlorothiazide (DIOVAN HCT) 80-12.5 MG tablet Take 0.5 tablets by mouth daily.     No Known Allergies  Social History   Socioeconomic History  . Marital status: Married    Spouse name: Not on file  . Number of children: Not on file  . Years of education: Not on file  . Highest education level: Not on file  Occupational History  . Not on file  Social Needs  . Financial resource strain: Not on file  . Food insecurity    Worry: Not on file    Inability: Not on file  . Transportation needs    Medical: Not on file    Non-medical: Not on file  Tobacco Use  . Smoking status: Former Smoker    Packs/day: 2.00    Years: 40.00    Pack years: 80.00    Types: Cigarettes    Quit date: 12/16/2010    Years since quitting: 8.4  . Smokeless tobacco: Current User    Types: Snuff  Substance and Sexual Activity  . Alcohol use: Yes    Alcohol/week: 28.0 standard drinks    Types: 28 Standard drinks or equivalent per week    Comment:  daily  . Drug use: No  . Sexual activity: Not on file  Lifestyle  . Physical activity    Days per week: Not on file    Minutes per session: Not on file  . Stress: Not on file  Relationships  . Social Herbalist on phone: Not on file    Gets together: Not on file    Attends religious service: Not on file    Active member of club or organization: Not on file    Attends meetings of clubs or organizations: Not on file    Relationship status: Not on file  . Intimate partner violence    Fear of current or ex partner: Not on file    Emotionally abused: Not on file    Physically abused: Not on file     Forced sexual activity: Not on file  Other Topics Concern  . Not on file  Social History Narrative  . Not on file     Review of Systems: General: negative for chills, fever, night sweats or weight changes.  Cardiovascular: negative for chest pain, dyspnea on exertion, edema, orthopnea, palpitations, paroxysmal nocturnal dyspnea or shortness of breath Dermatological: negative for rash Respiratory: negative for cough or wheezing Urologic: negative for hematuria Abdominal: negative for nausea, vomiting, diarrhea, bright red blood per rectum, melena, or hematemesis Neurologic: negative for visual changes, syncope, or dizziness All other systems reviewed and are otherwise negative except as noted above.    Blood pressure 130/72, pulse 76, temperature 97.9 F (36.6 C), height 6' (1.829 m), weight 197 lb (89.4 kg), SpO2 98 %.  General appearance: alert and no distress Neck: no adenopathy, no carotid bruit, no JVD, supple, symmetrical, trachea midline and thyroid not enlarged, symmetric, no tenderness/mass/nodules Lungs: clear to auscultation bilaterally Heart: regular rate and rhythm, S1, S2 normal, no murmur, click, rub or gallop Extremities: extremities normal, atraumatic, no cyanosis or edema Pulses: 2+ and symmetric Skin: Skin color, texture, turgor normal. No rashes or lesions Neurologic: Alert and oriented X 3, normal strength and tone. Normal symmetric reflexes. Normal coordination and gait  EKG not performed today  ASSESSMENT AND PLAN:   Hypertension History of essential hypertension with blood pressure measured today 130/72.  He is on Bystolic, Diovan and hydrochlorothiazide.  Hyperlipidemia History of hyperlipidemia not on statin therapy with lipid profile performed 12/24/2017 revealing total cholesterol 166, LDL 104 and HDL 42.  Palpitations History of palpitations on low-dose Bystolic, improved  Bilateral renal artery stenosis ([facility]) Renal Dopplers performed 10/20/2015  showed moderate left renal artery stenosis.  His blood pressure appears well controlled.  I am going to get repeat renal Doppler studies.      Lorretta Harp MD FACP,FACC,FAHA, [facility] 06/03/2019 11:19 AM

## 2019-06-03 NOTE — Assessment & Plan Note (Signed)
Renal Dopplers performed 10/20/2015 showed moderate left renal artery stenosis.  His blood pressure appears well controlled.  I am going to get repeat renal Doppler studies.

## 2019-06-03 NOTE — Assessment & Plan Note (Signed)
History of hyperlipidemia not on statin therapy with lipid profile performed 12/24/2017 revealing total cholesterol 166, LDL 104 and HDL 42.

## 2019-06-03 NOTE — Assessment & Plan Note (Signed)
History of palpitations on low-dose Bystolic, improved

## 2019-06-03 NOTE — Patient Instructions (Addendum)
Your cardiac CT will be scheduled at one of the below locations:   [facility] [address] [place], [place] [address] (336) [place] [address] [place], [place] [address] [phone]  If scheduled at [facility], please arrive at the [facility] main entrance of [facility] 30-45 minutes prior to test start time. Proceed to the [facility] Radiology Department (first floor) to check-in and test prep.  If scheduled at [facility], please arrive 15 mins early for check-in and test prep.  Please follow these instructions carefully (unless otherwise directed):  Hold all erectile dysfunction medications at least 3 days (72 hrs) prior to test.  On the Night Before the Test: . Be sure to Drink plenty of water. . Do not consume any caffeinated/decaffeinated beverages or chocolate 12 hours prior to your test. . Do not take any antihistamines 12 hours prior to your test. . If the patient has contrast allergy: ? Patient will need a prescription for Prednisone and very clear instructions (as follows): 1. Prednisone 50 mg - take 13 hours prior to test 2. Take another Prednisone 50 mg 7 hours prior to test 3. Take another Prednisone 50 mg 1 hour prior to test 4. Take Benadryl 50 mg 1 hour prior to test . Patient must complete all four doses of above prophylactic medications. . Patient will need a ride after test due to Benadryl.  On the Day of the Test: . Drink plenty of water. Do not drink any water within one hour of the test. . Do not eat any food 4 hours prior to the test. . You may take your regular medications prior to the test.  . Take metoprolol (Lopressor) 100 mg two hours prior to test. . HOLD Diovan on morning of the test.       After the Test: . Drink plenty of water. . After receiving IV contrast, you may experience a mild flushed feeling. This is  normal. . On occasion, you may experience a mild rash up to 24 hours after the test. This is not dangerous. If this occurs, you can take Benadryl 25 mg and increase your fluid intake. . If you experience trouble breathing, this can be serious. If it is severe call 911 IMMEDIATELY. If it is mild, please call our office. . If you take any of these medications: Glipizide/Metformin, Avandament, Glucavance, please do not take 48 hours after completing test unless otherwise instructed.    Please contact the cardiac imaging nurse navigator should you have any questions/concerns Marchia Bond, RN Navigator Cardiac Imaging [facility] [phone] Office  [phone] Cell   _________________________________________________________________  Medication Instructions:  Your physician recommends that you continue on your current medications as directed. Please refer to the Current Medication list given to you today.  If you need a refill on your cardiac medications before your next appointment, please call your pharmacy.   Lab work: Your physician recommends that you have lab work today: CBC and BMP  If you have labs (blood work) drawn today and your tests are completely normal, you will receive your results only by: Marland Kitchen MyChart Message (if you have MyChart) OR . A paper copy in the mail If you have any lab test that is abnormal or we need to change your treatment, we will call you to review the results.  Testing/Procedures: Your physician has requested that you have a renal  artery duplex. During this test, an ultrasound is used to evaluate blood flow to the kidneys. Allow one hour for this exam. Do not eat after midnight the day before and avoid carbonated beverages. Take your medications as you usually do. TO BE SCHEDULED  Follow-Up: At [facility], you and your health needs are our priority.  As part of our continuing mission to provide you with exceptional heart  care, we have created designated Provider Care Teams.  These Care Teams include your primary Cardiologist (physician) and Advanced Practice Providers (APPs -  Physician Assistants and Nurse Practitioners) who all work together to provide you with the care you need, when you need it. You will need a follow up appointment in 3 months [place], NP AND IN 12 MONTHS with Dr. Quay Burow.  Please call our office 2 months in advance to schedule each appointment.

## 2019-06-17 ENCOUNTER — Encounter (HOSPITAL_COMMUNITY): Payer: Self-pay

## 2019-06-25 ENCOUNTER — Other Ambulatory Visit (HOSPITAL_COMMUNITY): Payer: Self-pay | Admitting: Cardiovascular Disease

## 2019-06-25 ENCOUNTER — Encounter: Payer: Self-pay | Admitting: *Deleted

## 2019-06-25 ENCOUNTER — Ambulatory Visit (HOSPITAL_COMMUNITY)
Admission: RE | Admit: 2019-06-25 | Discharge: 2019-06-25 | Disposition: A | Discharge status: Home / Self Care | Payer: BC Managed Care – PPO | Source: Ambulatory Visit | Attending: Cardiology | Admitting: Cardiology

## 2019-06-25 ENCOUNTER — Other Ambulatory Visit: Payer: Self-pay

## 2019-06-25 ENCOUNTER — Other Ambulatory Visit: Payer: Self-pay | Admitting: *Deleted

## 2019-06-25 DIAGNOSIS — I701 Atherosclerosis of renal artery: Secondary | ICD-10-CM

## 2019-07-06 ENCOUNTER — Telehealth (HOSPITAL_COMMUNITY): Payer: Self-pay | Admitting: Emergency Medicine

## 2019-07-06 NOTE — Telephone Encounter (Signed)
Left message on voicemail with name and callback number Chidera Thivierge RN Navigator Cardiac Imaging [place] [facility] [phone] Office [phone] Cell  

## 2019-07-07 ENCOUNTER — Telehealth (HOSPITAL_COMMUNITY): Payer: Self-pay | Admitting: Emergency Medicine

## 2019-07-07 MED ORDER — METOPROLOL TARTRATE 100 MG PO TABS: 100.0000 mg | ORAL_TABLET | Freq: Once | ORAL | 0 refills | Status: DC

## 2019-07-07 NOTE — Telephone Encounter (Signed)
Returning call after patient left me VM regarding CTA instructions, will try again soon

## 2019-07-07 NOTE — Telephone Encounter (Signed)
Pt calling stating that he had misplaced his one time dose metoprolol, another prescription submitted to his pharmacy

## 2019-07-08 ENCOUNTER — Encounter: Payer: BC Managed Care – PPO | Admitting: *Deleted

## 2019-07-08 ENCOUNTER — Telehealth: Payer: Self-pay | Admitting: Cardiovascular Disease

## 2019-07-08 ENCOUNTER — Other Ambulatory Visit: Payer: Self-pay

## 2019-07-08 ENCOUNTER — Ambulatory Visit (HOSPITAL_COMMUNITY)
Admission: RE | Admit: 2019-07-08 | Discharge: 2019-07-08 | Disposition: A | Discharge status: Home / Self Care | Payer: BC Managed Care – PPO | Source: Ambulatory Visit | Attending: Cardiovascular Disease | Admitting: Cardiovascular Disease

## 2019-07-08 ENCOUNTER — Ambulatory Visit (HOSPITAL_COMMUNITY): Payer: BC Managed Care – PPO

## 2019-07-08 DIAGNOSIS — R079 Chest pain, unspecified: Secondary | ICD-10-CM | POA: Insufficient documentation

## 2019-07-08 DIAGNOSIS — Z006 Encounter for examination for normal comparison and control in clinical research program: Secondary | ICD-10-CM

## 2019-07-08 MED ORDER — NITROGLYCERIN 0.4 MG SL SUBL
0.8000 mg | SUBLINGUAL_TABLET | SUBLINGUAL | Status: DC | PRN
  Administered 2019-07-08: 09:00:00 0.8 mg via SUBLINGUAL

## 2019-07-08 MED ORDER — IOHEXOL 350 MG/ML SOLN
80.0000 mL | Freq: Once | INTRAVENOUS | Status: AC | PRN
  Administered 2019-07-08: 80 mL via INTRAVENOUS

## 2019-07-08 MED ORDER — NITROGLYCERIN 0.4 MG SL SUBL
SUBLINGUAL_TABLET | SUBLINGUAL | Status: AC
  Filled 2019-07-08: qty 2

## 2019-07-08 NOTE — Telephone Encounter (Signed)
Follow Up:    Pt is returning your call,concerning his CT results.

## 2019-07-08 NOTE — Research (Signed)
CADFEM Informed Consent                  Subject Name:   Daniel [name]   Subject met inclusion and exclusion criteria.  The informed consent form, study requirements and expectations were reviewed with the subject and questions and concerns were addressed prior to the signing of the consent form.  The subject verbalized understanding of the trial requirements.  The subject agreed to participate in the CADFEM trial and signed the informed consent.  The informed consent was obtained prior to performance of any protocol-specific procedures for the subject.  A copy of the signed informed consent was given to the subject and a copy was placed in the subject's medical record.   [place] Chalmers, Research Assistant 07/08/2019 07:24 a.m.

## 2019-07-08 NOTE — Progress Notes (Signed)
Pt arrived to CT appointment w/ HR of 46-49 BP of 125/82. Pt reports that his HR was fairly low this am and therefore he elected to take HALF of his prescribed metoprolol. Pt has 50mg  of metoprolol on board he reports that he took it at 0630. Will continue to monitor closely.

## 2019-07-08 NOTE — Telephone Encounter (Signed)
Pt updated and voiced understanding.  

## 2019-07-09 ENCOUNTER — Ambulatory Visit (HOSPITAL_COMMUNITY): Payer: BC Managed Care – PPO

## 2019-07-09 ENCOUNTER — Ambulatory Visit (HOSPITAL_COMMUNITY)
Admission: RE | Admit: 2019-07-09 | Discharge: 2019-07-09 | Disposition: A | Discharge status: Home / Self Care | Payer: BC Managed Care – PPO | Source: Ambulatory Visit | Attending: Cardiovascular Disease | Admitting: Cardiovascular Disease

## 2019-07-09 DIAGNOSIS — R079 Chest pain, unspecified: Secondary | ICD-10-CM | POA: Insufficient documentation

## 2019-07-12 DIAGNOSIS — R079 Chest pain, unspecified: Secondary | ICD-10-CM | POA: Diagnosis not present

## 2019-07-27 DIAGNOSIS — Z20828 Contact with and (suspected) exposure to other viral communicable diseases: Secondary | ICD-10-CM | POA: Diagnosis not present

## 2019-08-18 NOTE — Progress Notes (Addendum)
Cardiology Clinic Note   Patient Name: Daniel [name] Date of Encounter: 08/19/2019  Primary Care Provider:  Sharilyn Sites, MD Primary Cardiologist:  Daniel Burow, MD  Patient Profile    Daniel [name] is a 63 year old male presents today for follow-up of his essential hypertension, hyperlipidemia, hypothyroidism, tobacco abuse, and palpitations.  Past Medical History    Past Medical History:  Diagnosis Date  . Anxiety   . Bilateral renal artery stenosis ([place])   . History of tobacco abuse   . Hyperlipidemia   . Hypertension   . Hypothyroidism   . Palpitations   . Thyroid disease    Past Surgical History:  Procedure Laterality Date  . COLONOSCOPY N/A 07/11/2016   Procedure: COLONOSCOPY;  Surgeon: Daniel Houston, MD;  Location: [facility];  Service: Endoscopy;  Laterality: N/A;  930  . MENISCUS REPAIR Left 05/29/13  . POLYPECTOMY  07/11/2016   Procedure: POLYPECTOMY;  Surgeon: Daniel Houston, MD;  Location: [facility];  Service: Endoscopy;;  colon  . US ECHOCARDIOGRAPHY  07/18/2012    Allergies  No Known Allergies  History of Present Illness    Daniel [name] has a PMH of essential hypertension, bilateral renal artery stenosis, hyperlipidemia, hypothyroid, tobacco abuse and palpitations.  He had a negative Myoview stress test on 07/2012 and also an 2013 had an echocardiogram that showed LVEF 50 to 55% and was otherwise unremarkable.  In February 2016 he was placed on an event monitor that showed normal sinus rhythm.  He has previously been seen by Dr. Gwenlyn [name] and was also seen by Daniel [name] for palpitations on 05/15/2017.  During this time he described palpitations as single skipped heartbeats.  During that visit his Bystolic was increased from 2.5 mg to 5 mg.  His EKG from December 2019 showed sinus bradycardia.  He was seen by me on 02/13/2019.  During that time he endorsed palpitations that had increased in frequency over the last month which are lasting  2 to 3  minutes at a time.  He had noticed that palpitations start midday but, dissipate after he takes his Bystolic medication in the afternoon.  He had increased stress at work related to COVID-19.  He also stated he had been taking expired Bystolic that was given to him by a family member.  He  had not had palpitations for 3 to 5 days denied chest pain, shortness of breath, dizziness, syncope, lower extremity swelling, orthopnea, or PND.  He was seen by Dr. Gwenlyn [name] on 06/03/2019.  During that time he was doing well.  His palpitations had improved/resolved with low-dose beta-blocker therapy.  He also stated that over the previous 4-6 weeks he noticed bilateral upper extremity discomfort occurring randomly but without chest pain.  His renal Dopplers were repeated at that time and showed a slight progression of his left renal artery stenosis. Recommendedrepeat in12 months.  His CTA showed two-vessel coronary artery disease and has LAD and circumflex vessels.  A follow-up FFR study was done and showed all vessels greater than 0.8.   He presents the clinic today and states he feels well.  He has not had any recurrent episodes of palpitations since May.  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  He denies chest pain with exertion and states he is breathing well.  He does state that he uses snus tobacco pouches about a can per day.  I reviewed his CT angio and FFR studies as well as his renal Dopplers  and he expresses understanding.  He states he had statin intolerance in the past but is willing to try Crestor 5 milligrams 3x times per week.His LDL cholesterol was slightly elevated at 104 on his last draw.  He states he had cream in his coffee this morning I will order a direct LDL today.    He denies chest pain, shortness of breath, lower extremity edema, fatigue, palpitations, melena, hematuria, hemoptysis, diaphoresis, weakness, presyncope, syncope, orthopnea, and PND.   Home Medications    Prior to  Admission medications   Medication Sig Start Date End Date Taking? Authorizing Provider  escitalopram (LEXAPRO) 10 MG tablet Take 10 mg by mouth at bedtime.     [provider]  ibuprofen (ADVIL,MOTRIN) 200 MG tablet Take 200-400 mg by mouth every 6 (six) hours as needed (For pain/arthritis.).    [provider]  levothyroxine (SYNTHROID, LEVOTHROID) 125 MCG tablet Take 62.5-125 mcg by mouth as directed. 62.5 MCG (ONE-HALF TABLET) ON Thursday AND Friday, 125 MCG (ONE TABLET) ON ALL OTHER DAYS.    [provider]  metoprolol tartrate (LOPRESSOR) 100 MG tablet Take 1 tablet (100 mg total) by mouth once for 1 dose. Take 2 hours prior to your coronary cta 07/07/19 07/07/19  Daniel Harp, MD  nebivolol (BYSTOLIC) 5 MG tablet Take 1 tablet (5 mg total) by mouth every morning. 02/13/19   Daniel Pelton, NP  Testosterone 30 MG MISC Place inside cheek.    [provider]  valsartan-hydrochlorothiazide (DIOVAN HCT) 80-12.5 MG tablet Take 0.5 tablets by mouth daily. Patient not taking: Reported on 07/08/2019 09/12/18   Daniel Harp, MD    Family History    Family History  Problem Relation Age of Onset  . Hyperlipidemia Father   . Hypertension Father   . Stroke Father   . CAD Maternal Grandmother   . Heart attack Maternal Grandfather   . Cancer Mother   . Diabetes Mother   . Hypertension Mother    He indicated that the status of his mother is unknown. He indicated that his father is deceased. He indicated that his sister is alive. He indicated that his maternal grandmother is deceased. He indicated that his maternal grandfather is deceased. He indicated that his paternal grandmother is deceased. He indicated that his paternal grandfather is deceased.  Social History    Social History   Socioeconomic History  . Marital status: Married    Spouse name: Not on file  . Number of children: Not on file  . Years of education: Not on file  . Highest  education level: Not on file  Occupational History  . Not on file  Social Needs  . Financial resource strain: Not on file  . Food insecurity    Worry: Not on file    Inability: Not on file  . Transportation needs    Medical: Not on file    Non-medical: Not on file  Tobacco Use  . Smoking status: Former Smoker    Packs/day: 2.00    Years: 40.00    Pack years: 80.00    Types: Cigarettes    Quit date: 12/16/2010    Years since quitting: 8.6  . Smokeless tobacco: Current User    Types: Snuff  Substance and Sexual Activity  . Alcohol use: Yes    Alcohol/week: 28.0 standard drinks    Types: 28 Standard drinks or equivalent per week    Comment: daily  . Drug use: No  . Sexual activity: Not  on file  Lifestyle  . Physical activity    Days per week: Not on file    Minutes per session: Not on file  . Stress: Not on file  Relationships  . Social Herbalist on phone: Not on file    Gets together: Not on file    Attends religious service: Not on file    Active member of club or organization: Not on file    Attends meetings of clubs or organizations: Not on file    Relationship status: Not on file  . Intimate partner violence    Fear of current or ex partner: Not on file    Emotionally abused: Not on file    Physically abused: Not on file    Forced sexual activity: Not on file  Other Topics Concern  . Not on file  Social History Narrative  . Not on file     Review of Systems    General:  No chills, fever, night sweats or weight changes.  Cardiovascular:  No chest pain, dyspnea on exertion, edema, orthopnea, palpitations, paroxysmal nocturnal dyspnea. Dermatological: No rash, lesions/masses Respiratory: No cough, dyspnea Urologic: No hematuria, dysuria Abdominal:   No nausea, vomiting, diarrhea, bright red blood per rectum, melena, or hematemesis Neurologic:  No visual changes, wkns, changes in mental status. All other systems reviewed and are otherwise negative  except as noted above.  Physical Exam    VS:  BP 138/72   Pulse (!) 46   Temp 97.9 F (36.6 C)   Ht 6' (1.829 m)   Wt 203 lb (92.1 kg)   SpO2 99%   BMI 27.53 kg/m  , BMI Body mass index is 27.53 kg/m. GEN: Well nourished, well developed, in no acute distress. HEENT: normal. Neck: Supple, no JVD, carotid bruits, or masses. Cardiac: RRR, no murmurs, rubs, or gallops. No clubbing, cyanosis, edema.  Radials/DP/PT 2+ and equal bilaterally.  Respiratory:  Respirations regular and unlabored, clear to auscultation bilaterally. GI: Soft, nontender, nondistended, BS + x 4. MS: no deformity or atrophy. Skin: warm and dry, no rash. Neuro:  Strength and sensation are intact. Psych: Normal affect.  Accessory Clinical Findings    ECG personally reviewed by me today-sinus bradycardia 46 bpm- No acute changes  EKG 02/13/2019 Sinus bradycardia nonspecific ST abnormality 55 bpm  Coronary CTA 07/08/2019 FINDINGS: Non-cardiac: See separate report from [facility] Radiology.  Pulmonary veins drain normally to the left atrium.  Calcium Score: 1129 Agatston units.  Coronary Arteries: Left dominant with no anomalies  LM: Calcified plaque with no significant stenosis.  LAD system: Extensive calcified plaque proximal and mid LAD, probably mild (<50%) stenosis.  Circumflex system: Calcified plaque proximal and mid LCx, mild (<50%) stenosis. Calcified plaque in proximal PLOM, mild (<50%) stenosis. Calcified plaque proximal left PDA, mild (<50%) stenosis.  RCA system: Small, nondominant vessel. Mixed plaque with about 50% stenosis proximally.  IMPRESSION: 1. Coronary artery calcium score 1129 Agatston units. This places the patient in the 94th percentile for age and gender.  2. Extensive primarily calcified plaque in the LAD and dominant LCx. Suspect not hemodynamically significant, but will send for FFR.  CT coronary FFR 07/09/2019 FINDINGS: There was no evidence for  hemodynamically significant stenosis, all FFR > 0.8.  Renal US 06/25/2019 Summary: Renal:   Right: Normal size right kidney. Normal right Resisitive Index.        Normal cortical thickness of right kidney. 1-59% stenosis of        the  right renal artery. RRV flow present. Left:  Normal size of left kidney. Normal left Resistive Index.        Normal cortical thickness of the left kidney. Evidence of a >        60% stenosis in the left renal artery. Mesenteric: Normal Superior Mesenteric artery findings. 70 to 99% stenosis in the celiac artery. Areas of limited visceral study include left parenchymal flow.   *See table(s) above for measurements and observations.   Suggest follow up study in 12 months.  Assessment & Plan   1.  Coronary artery calcification -no chest pain today.  CT angio on 07/08/2019 showed a calcium score of 1129 and extensive primary calcification of the LAD and dominant circumflex.  A follow-up FFR showed all vessels were greater than 0.8. Start Crestor 5 mg 3 times per week Heart healthy low-sodium high-fiber diet Increase physical activity as tolerated  Palpitations-no recent episodes of palpatations.  EKG today shows sinus bradycardia 46 bpm. Change dosing time to a.m. Bystolic 5 mg daily Continue Diovan 80-12.5  Half tablet daily Obtain a new prescription for Bystolic and stop using old prescription. Practice reduce stress and avoid triggers Limit caffeinated beverages and chocolate.   Essential hypertension-BP today 138/72.Well controlled at home. Continue Diovan 80-12.5 mg daily Continue blood pressures log at home  Begin exercise regimen with walking-goal of 150 minutes of moderate physical activity weekly Practice stress reduction   Pure hypercholesterolemia-LDL 104 12/24/2017 Heart healthy low sodium high fiber diet Increase physical activity as tolerated  Order direct LDL today-patient did not eat but had cream in his coffee this morning.   Bilateral renal artery stenosis- Renal dopplers 06/2019 showed slight increased left renal artery stenosis.  Recommend repeat dopplers in 1 year.   Disopsition: Follow up with Dr. Gwenlyn [name] in 3 months.    Jossie Ng. [facility] [place] [address] Office [phone] Fax [phone]

## 2019-08-19 ENCOUNTER — Other Ambulatory Visit: Payer: Self-pay

## 2019-08-19 ENCOUNTER — Telehealth: Payer: Self-pay | Admitting: General Practice

## 2019-08-19 ENCOUNTER — Ambulatory Visit (INDEPENDENT_AMBULATORY_CARE_PROVIDER_SITE_OTHER): Payer: Medicare Other | Admitting: General Practice

## 2019-08-19 ENCOUNTER — Encounter: Payer: Self-pay | Admitting: General Practice

## 2019-08-19 VITALS — BP 138/72 | HR 46 | Temp 97.9°F | Ht 72.0 in | Wt 203.0 lb

## 2019-08-19 DIAGNOSIS — I10 Essential (primary) hypertension: Secondary | ICD-10-CM | POA: Diagnosis not present

## 2019-08-19 DIAGNOSIS — I701 Atherosclerosis of renal artery: Secondary | ICD-10-CM

## 2019-08-19 DIAGNOSIS — E782 Mixed hyperlipidemia: Secondary | ICD-10-CM

## 2019-08-19 DIAGNOSIS — R002 Palpitations: Secondary | ICD-10-CM | POA: Diagnosis not present

## 2019-08-19 DIAGNOSIS — I2510 Atherosclerotic heart disease of native coronary artery without angina pectoris: Secondary | ICD-10-CM

## 2019-08-19 DIAGNOSIS — I2584 Coronary atherosclerosis due to calcified coronary lesion: Secondary | ICD-10-CM

## 2019-08-19 LAB — LDL CHOLESTEROL, DIRECT: LDL Direct: 114 mg/dL — ABNORMAL HIGH (ref 0–99)

## 2019-08-19 MED ORDER — ROSUVASTATIN CALCIUM 5 MG PO TABS: ORAL_TABLET | ORAL | 1 refills | Status: DC

## 2019-08-19 NOTE — Patient Instructions (Addendum)
Medication Instructions:   START Crestor (Rosuvastatin) 2.5 mg 3 times a week  *If you need a refill on your cardiac medications before your next appointment, please call your pharmacy*  Lab Work: You will need to have labs (blood work) drawn today and again in 3 months:  Direct LDL-TODAY  Fasting Lipid Panel-DO NOT EAT OR DRINK PAST MIDNIGHT (WATER OK TO DRINK)-3 MONTHS  CMET If you have labs (blood work) drawn today and your tests are completely normal, you will receive your results only by: Marland Kitchen MyChart Message (if you have MyChart) OR . A paper copy in the mail If you have any lab test that is abnormal or we need to change your treatment, we will call you to review the results.  Testing/Procedures: NONE ordered at this time of appointment   Follow-Up: At [facility], you and your health needs are our priority.  As part of our continuing mission to provide you with exceptional heart care, we have created designated Provider Care Teams.  These Care Teams include your primary Cardiologist (physician) and Advanced Practice Providers (APPs -  Physician Assistants and Nurse Practitioners) who all work together to provide you with the care you need, when you need it.  Your next appointment:   3 month(s)  The format for your next appointment:   In Person  Provider:   Quay Burow, MD  Other Instructions   High-Fiber Diet Fiber, also called dietary fiber, is a type of carbohydrate that is found in fruits, vegetables, whole grains, and beans. A high-fiber diet can have many health benefits. Your health care provider may recommend a high-fiber diet to help:  Prevent constipation. Fiber can make your bowel movements more regular.  Lower your cholesterol.  Relieve the following conditions: ? Swelling of veins in the anus (hemorrhoids). ? Swelling and irritation (inflammation) of specific areas of the digestive tract (uncomplicated diverticulosis). ? A problem of the large  intestine (colon) that sometimes causes pain and diarrhea (irritable bowel syndrome, IBS).  Prevent overeating as part of a weight-loss plan.  Prevent heart disease, type 2 diabetes, and certain cancers. What is my plan? The recommended daily fiber intake in grams (g) includes:  38 g for men age 21 or younger.  30 g for men over age 43.  77 g for women age 66 or younger.  21 g for women over age 22. You can get the recommended daily intake of dietary fiber by:  Eating a variety of fruits, vegetables, grains, and beans.  Taking a fiber supplement, if it is not possible to get enough fiber through your diet. What do I need to know about a high-fiber diet?  It is better to get fiber through food sources rather than from fiber supplements. There is not a lot of research about how effective supplements are.  Always check the fiber content on the nutrition facts label of any prepackaged food. Look for foods that contain 5 g of fiber or more per serving.  Talk with a diet and nutrition specialist (dietitian) if you have questions about specific foods that are recommended or not recommended for your medical condition, especially if those foods are not listed below.  Gradually increase how much fiber you consume. If you increase your intake of dietary fiber too quickly, you may have bloating, cramping, or gas.  Drink plenty of water. Water helps you to digest fiber. What are tips for following this plan?  Eat a wide variety of high-fiber foods.  Make sure  that half of the grains that you eat each day are whole grains.  Eat breads and cereals that are made with whole-grain flour instead of refined flour or white flour.  Eat brown rice, bulgur wheat, or millet instead of white rice.  Start the day with a breakfast that is high in fiber, such as a cereal that contains 5 g of fiber or more per serving.  Use beans in place of meat in soups, salads, and pasta dishes.  Eat high-fiber  snacks, such as berries, raw vegetables, nuts, and popcorn.  Choose whole fruits and vegetables instead of processed forms like juice or sauce. What foods can I eat?  Fruits Berries. Pears. Apples. Oranges. Avocado. Prunes and raisins. Dried figs. Vegetables Sweet potatoes. Spinach. Kale. Artichokes. Cabbage. Broccoli. Cauliflower. Green peas. Carrots. Squash. Grains Whole-grain breads. Multigrain cereal. Oats and oatmeal. Brown rice. Barley. Bulgur wheat. [place]. Quinoa. Bran muffins. Popcorn. Rye wafer crackers. Meats and other proteins Navy, kidney, and pinto beans. Soybeans. Split peas. Lentils. Nuts and seeds. Dairy Fiber-fortified yogurt. Beverages Fiber-fortified soy milk. Fiber-fortified orange juice. Other foods Fiber bars. The items listed above may not be a complete list of recommended foods and beverages. Contact a dietitian for more options. What foods are not recommended? Fruits Fruit juice. Cooked, strained fruit. Vegetables Fried potatoes. Canned vegetables. Well-cooked vegetables. Grains White bread. Pasta made with refined flour. White rice. Meats and other proteins Fatty cuts of meat. Fried chicken or fried fish. Dairy Milk. Yogurt. Cream cheese. Sour cream. Fats and oils Butters. Beverages Soft drinks. Other foods Cakes and pastries. The items listed above may not be a complete list of foods and beverages to avoid. Contact a dietitian for more information. Summary  Fiber is a type of carbohydrate. It is found in fruits, vegetables, whole grains, and beans.  There are many health benefits of eating a high-fiber diet, such as preventing constipation, lowering blood cholesterol, helping with weight loss, and reducing your risk of heart disease, diabetes, and certain cancers.  Gradually increase your intake of fiber. Increasing too fast can result in cramping, bloating, and gas. Drink plenty of water while you increase your fiber.  The best sources of  fiber include whole fruits and vegetables, whole grains, nuts, seeds, and beans. This information is not intended to replace advice given to you by your health care provider. Make sure you discuss any questions you have with your health care provider. Document Released: 09/03/2005 Document Revised: 07/08/2017 Document Reviewed: 07/08/2017 Elsevier Patient Education  2020 [employer].

## 2019-08-19 NOTE — Telephone Encounter (Signed)
After speaking with Coletta Memos, NP he is agreeable with the patient taking the whole tablet 5 mg 3 times a week. Called the patient's pharmacy and spoke with Jonni Sanger informing him that the prescribing NP Coletta Memos is ok with the patient taking Crestor 5 mg 3 times a week. Jonni Sanger thanked me for calling

## 2019-08-19 NOTE — Telephone Encounter (Signed)
Incoming call from [place] in [facility], counselling. Rosa Sanchez calling regarding pt rosuvastatin. Spoke with Jonni Sanger who states pt Rx for rosuvastatin 5 mg with sig "take 2.5 mg 3 times a week" was sent to [facility] to be filled. Per Jonni Sanger, the 5 mg tablet is very small and has no scoring and he feels it will be difficult for pt to cut tablet in half. He would like Coletta Memos, NP to advise. Informed Jonni Sanger that message to be routed and he would receive a return call with update. He is agreeable. Call back number [phone].  Contacted Jacqulynn Cadet, CMA via telephone to inform of the above. She states she will update Marilynn Rail, NP and call pharmacy

## 2019-08-19 NOTE — Telephone Encounter (Signed)
Called and left a detailed message for the patient informing him to take 5 mg a whole tablet of Crestor 3 times a week, and to monitor for side effects as discussed in office. Also stated to not take the   half tablet of Crestor 3 times a week as told when in office previously. Also stated that the reason for taking the whole tablet is due to the tablet not being able to be cut in half. Asked for him to give me a call back in the office so that he received the message and understand the directions.

## 2019-10-07 ENCOUNTER — Ambulatory Visit: Payer: Medicare Other | Attending: Internal Medicine

## 2019-10-07 DIAGNOSIS — Z23 Encounter for immunization: Secondary | ICD-10-CM

## 2019-10-07 NOTE — Progress Notes (Signed)
   Covid-19 Vaccination Clinic  Name:  Daniel [name]    MRN: [id number] DOB: [birth year]  10/07/2019  [name] was observed post Covid-19 immunization for 15 minutes without incidence. He was provided with Vaccine Information Sheet and instruction to access the V-Safe system.   Mr. Pouch was instructed to call 911 with any severe reactions post vaccine: Marland Kitchen Difficulty breathing  . Swelling of your face and throat  . A fast heartbeat  . A bad rash all over your body  . Dizziness and weakness    Immunizations Administered    Name Date Dose VIS Date Route   Pfizer COVID-19 Vaccine 10/07/2019 10:56 AM 0.3 mL 08/28/2019 Intramuscular   Manufacturer: [place]   Lot: [id number]   [place]: [id number]

## 2019-10-28 ENCOUNTER — Ambulatory Visit: Payer: Medicare Other | Attending: Internal Medicine

## 2019-10-28 DIAGNOSIS — I701 Atherosclerosis of renal artery: Secondary | ICD-10-CM | POA: Diagnosis not present

## 2019-10-28 DIAGNOSIS — I2584 Coronary atherosclerosis due to calcified coronary lesion: Secondary | ICD-10-CM | POA: Diagnosis not present

## 2019-10-28 DIAGNOSIS — Z0001 Encounter for general adult medical examination with abnormal findings: Secondary | ICD-10-CM | POA: Diagnosis not present

## 2019-10-28 DIAGNOSIS — Z23 Encounter for immunization: Secondary | ICD-10-CM | POA: Insufficient documentation

## 2019-10-28 DIAGNOSIS — E039 Hypothyroidism, unspecified: Secondary | ICD-10-CM | POA: Diagnosis not present

## 2019-10-28 DIAGNOSIS — E7849 Other hyperlipidemia: Secondary | ICD-10-CM | POA: Diagnosis not present

## 2019-10-28 NOTE — Progress Notes (Signed)
   Covid-19 Vaccination Clinic  Name:  DEQUANTE HIDAY    MRN: [id number] DOB: [birth year]  10/28/2019  Mr. Folkerts was observed post Covid-19 immunization for 15 minutes without incidence. He was provided with Vaccine Information Sheet and instruction to access the V-Safe system.   Mr. Cerney was instructed to call 911 with any severe reactions post vaccine: Marland Kitchen Difficulty breathing  . Swelling of your face and throat  . A fast heartbeat  . A bad rash all over your body  . Dizziness and weakness    Immunizations Administered    Name Date Dose VIS Date Route   Pfizer COVID-19 Vaccine 10/28/2019  3:14 PM 0.3 mL 08/28/2019 Intramuscular   Manufacturer: [place]   Lot: [id number]   [place]: [id number]

## 2019-11-18 ENCOUNTER — Other Ambulatory Visit: Payer: Self-pay

## 2019-11-18 ENCOUNTER — Encounter: Payer: Self-pay | Admitting: Cardiovascular Disease

## 2019-11-18 ENCOUNTER — Ambulatory Visit: Payer: Medicare Other | Admitting: Cardiovascular Disease

## 2019-11-18 VITALS — BP 138/82 | HR 62 | Temp 98.2°F | Ht 72.0 in | Wt 200.6 lb

## 2019-11-18 DIAGNOSIS — I10 Essential (primary) hypertension: Secondary | ICD-10-CM | POA: Diagnosis not present

## 2019-11-18 DIAGNOSIS — R002 Palpitations: Secondary | ICD-10-CM | POA: Diagnosis not present

## 2019-11-18 DIAGNOSIS — E782 Mixed hyperlipidemia: Secondary | ICD-10-CM

## 2019-11-18 DIAGNOSIS — R0789 Other chest pain: Secondary | ICD-10-CM | POA: Insufficient documentation

## 2019-11-18 DIAGNOSIS — I701 Atherosclerosis of renal artery: Secondary | ICD-10-CM | POA: Diagnosis not present

## 2019-11-18 LAB — HEPATIC FUNCTION PANEL
ALT: 19 IU/L (ref 0–44)
AST: 26 IU/L (ref 0–40)
Albumin: 5 g/dL — ABNORMAL HIGH (ref 3.8–4.8)
Alkaline Phosphatase: 64 IU/L (ref 39–117)
Bilirubin Total: 0.6 mg/dL (ref 0.0–1.2)
Bilirubin, Direct: 0.17 mg/dL (ref 0.00–0.40)
Total Protein: 7.3 g/dL (ref 6.0–8.5)

## 2019-11-18 LAB — LIPID PANEL
Chol/HDL Ratio: 2.8 ratio (ref 0.0–5.0)
Cholesterol, Total: 158 mg/dL (ref 100–199)
HDL: 57 mg/dL (ref >39)
LDL Chol Calc (NIH): 78 mg/dL (ref 0–99)
Triglycerides: 135 mg/dL (ref 0–149)
VLDL Cholesterol Cal: 23 mg/dL (ref 5–40)

## 2019-11-18 NOTE — Assessment & Plan Note (Signed)
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  He does admit to caffeine intake as well as alcohol.  His primary care physician just halfed  his Bystolic because of bradycardia.  He also admits to stress as a cause of this.  When he had the weekend off or had a week off hunting he no longer was symptomatic.

## 2019-11-18 NOTE — Assessment & Plan Note (Signed)
Daniel [name] had bilateral upper extremity discomfort when I saw him last in September.  A CTA with FFR was performed that showed no blockages with an FFR less than 0.8.  He no longer has any symptoms.

## 2019-11-18 NOTE — Assessment & Plan Note (Signed)
History of hyperlipidemia on low-dose Crestor.  He does say he is statin intolerant.  We will recheck a fasting lipid liver profile this morning

## 2019-11-18 NOTE — Assessment & Plan Note (Signed)
History of essential hypertension with blood pressure measured today 138/82.  He is on valsartan, hydrochlorothiazide and Bystolic.

## 2019-11-18 NOTE — Progress Notes (Signed)
11/18/2019 [name]   February 17, 1956  [id number]  Primary Physician Daniel Sites, MD Primary Cardiologist: Daniel Harp MD Daniel [name], [place]  HPI:  Daniel [name] is a 64 y.o.  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 I last saw  06/03/2019.He was previously a patient of Dr. Terance Ice. I apparently took care of his father prior to his death. His cardiac risk factor profile is positive for 80 pack years of tobacco abuse having quit7years ago, treated hypertension and mild hyperlipidemia. He has never had a heart for stroke. He denies chest pain or shortness of breath. He had a negative Myoview stress test 07/18/12 a normal 2-D echo at that time. His major complaint is of palpitations. This has been evaluated in the past with an event monitor, 2-D echo and Myoview. 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 He does admit to excessive caffeine or alcohol intake. Also has hypothyroidism on thyroid replacement therapy. Apparently has values were elevated and his primary care physician is titrating his thyroid replacement. He does complain of bilateral leg; claudicationwith lower extremity Dopplers Were performed 01/03/16 which were entirely normal.  He was complaining of palpitations prior to my my office visit several years ago and saw [facility] who titrated his beta-blocker resulting in improvement in his symptoms.   Since I saw him 6 months ago he did have a coronary CTA performed 10/22 5/20 revealing no hemodynamically significant lesions with all FFR greater than 0.8.  He has had more palpitations recently.  His primary care physician cut his Bystolic in half because of bradycardia.  He does think that anxiety has a big effect on his palpitations however.    Current Meds  Medication Sig  . escitalopram (LEXAPRO) 10 MG tablet Take 10 mg by mouth  at bedtime.   Marland Kitchen ibuprofen (ADVIL,MOTRIN) 200 MG tablet Take 200-400 mg by mouth every 6 (six) hours as needed (For pain/arthritis.).  Marland Kitchen levothyroxine (SYNTHROID, LEVOTHROID) 125 MCG tablet Take 62.5-125 mcg by mouth as directed. 62.5 MCG (ONE-HALF TABLET) ON Thursday AND Friday, 125 MCG (ONE TABLET) ON ALL OTHER DAYS.  Marland Kitchen nebivolol (BYSTOLIC) 5 MG tablet Take 1 tablet (5 mg total) by mouth every morning.  . rosuvastatin (CRESTOR) 5 MG tablet Take 2.5 mg 3 times a week  . Testosterone 30 MG MISC Place inside cheek.  . valsartan-hydrochlorothiazide (DIOVAN HCT) 80-12.5 MG tablet Take 0.5 tablets by mouth daily.     No Known Allergies  Social History   Socioeconomic History  . Marital status: Married    Spouse name: Not on file  . Number of children: Not on file  . Years of education: Not on file  . Highest education level: Not on file  Occupational History  . Not on file  Tobacco Use  . Smoking status: Former Smoker    Packs/day: 2.00    Years: 40.00    Pack years: 80.00    Types: Cigarettes    Quit date: 12/16/2010    Years since quitting: 8.9  . Smokeless tobacco: Current User    Types: Snuff  Substance and Sexual Activity  . Alcohol use: Yes    Alcohol/week: 28.0 standard drinks    Types: 28 Standard drinks or equivalent per week    Comment: daily  . Drug use: No  . Sexual activity: Not on file  Other  Topics Concern  . Not on file  Social History Narrative  . Not on file   Social Determinants of Health   Financial Resource Strain:   . Difficulty of Paying Living Expenses: Not on file  Food Insecurity:   . Worried About Charity fundraiser in the Last Year: Not on file  . Ran Out of Food in the Last Year: Not on file  Transportation Needs:   . Lack of Transportation (Medical): Not on file  . Lack of Transportation (Non-Medical): Not on file  Physical Activity:   . Days of Exercise per Week: Not on file  . Minutes of Exercise per Session: Not on file  Stress:   .  Feeling of Stress : Not on file  Social Connections:   . Frequency of Communication with Friends and Family: Not on file  . Frequency of Social Gatherings with Friends and Family: Not on file  . Attends Religious Services: Not on file  . Active Member of Clubs or Organizations: Not on file  . Attends Archivist Meetings: Not on file  . Marital Status: Not on file  Intimate Partner Violence:   . Fear of Current or Ex-Partner: Not on file  . Emotionally Abused: Not on file  . Physically Abused: Not on file  . Sexually Abused: Not on file     Review of Systems: General: negative for chills, fever, night sweats or weight changes.  Cardiovascular: negative for chest pain, dyspnea on exertion, edema, orthopnea, palpitations, paroxysmal nocturnal dyspnea or shortness of breath Dermatological: negative for rash Respiratory: negative for cough or wheezing Urologic: negative for hematuria Abdominal: negative for nausea, vomiting, diarrhea, bright red blood per rectum, melena, or hematemesis Neurologic: negative for visual changes, syncope, or dizziness All other systems reviewed and are otherwise negative except as noted above.    Blood pressure 138/82, pulse 62, temperature 98.2 F (36.8 C), height 6' (1.829 m), weight 200 lb 9.6 oz (91 kg), SpO2 97 %.  General appearance: alert and no distress Neck: no adenopathy, no carotid bruit, no JVD, supple, symmetrical, trachea midline and thyroid not enlarged, symmetric, no tenderness/mass/nodules Lungs: clear to auscultation bilaterally Heart: regular rate and rhythm, S1, S2 normal, no murmur, click, rub or gallop Extremities: extremities normal, atraumatic, no cyanosis or edema Pulses: 2+ and symmetric Skin: Skin color, texture, turgor normal. No rashes or lesions Neurologic: Alert and oriented X 3, normal strength and tone. Normal symmetric reflexes. Normal coordination and gait  EKG not performed today  ASSESSMENT AND PLAN:    Hypertension History of essential hypertension with blood pressure measured today 138/82.  He is on valsartan, hydrochlorothiazide and Bystolic.  Hyperlipidemia History of hyperlipidemia on low-dose Crestor.  He does say he is statin intolerant.  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  He does admit to caffeine intake as well as alcohol.  His primary care physician just halfed  his Bystolic because of bradycardia.  He also admits to stress as a cause of this.  When he had the weekend off or had a week off hunting he no longer was symptomatic.  Bilateral renal artery stenosis (HCC) History of bilateral renal artery stenosis with with recent Doppler performed 06/25/2019 revealing mild progression of disease with a left renal aortic ratio of 4 although his blood pressure is well controlled on his current medications.  We will continue to follow this noninvasively.  Atypical chest  pain Mr. Bolejack had bilateral upper extremity discomfort when I saw him last in September.  A CTA with FFR was performed that showed no blockages with an FFR less than 0.8.  He no longer has any symptoms.      Daniel Harp MD FACP,FACC,FAHA, [facility] 11/18/2019 9:42 AM

## 2019-11-18 NOTE — Patient Instructions (Signed)
Medication Instructions:  Your physician recommends that you continue on your current medications as directed. Please refer to the Current Medication list given to you today.  *If you need a refill on your cardiac medications before your next appointment, please call your pharmacy*   Lab Work: Your physician recommends that you return for lab work TODAY:  Fasting Lipid Panel  Hepatic Function test  If you have labs (blood work) drawn today and your tests are completely normal, you will receive your results only by: Marland Kitchen MyChart Message (if you have MyChart) OR . A paper copy in the mail If you have any lab test that is abnormal or we need to change your treatment, we will call you to review the results.   Testing/Procedures: Your physician has requested that you have a renal artery duplex. During this test, an ultrasound is used to evaluate blood flow to the kidneys. Allow one hour for this exam. Do not eat after midnight the day before and avoid carbonated beverages. Take your medications as you usually do.  Please schedule for October 2021   Follow-Up: At [facility], you and your health needs are our priority.  As part of our continuing mission to provide you with exceptional heart care, we have created designated Provider Care Teams.  These Care Teams include your primary Cardiologist (physician) and Advanced Practice Providers (APPs -  Physician Assistants and Nurse Practitioners) who all work together to provide you with the care you need, when you need it.  We recommend signing up for the patient portal called "MyChart".  Sign up information is provided on this After Visit Summary.  MyChart is used to connect with patients for Virtual Visits (Telemedicine).  Patients are able to view lab/test results, encounter notes, upcoming appointments, etc.  Non-urgent messages can be sent to your provider as well.   To learn more about what you can do with MyChart, go to  [email or web address].    Your next appointment:   6 month(s)  The format for your next appointment:   In Person  Provider:   Quay Burow, MD  Other Instructions

## 2019-11-18 NOTE — Assessment & Plan Note (Signed)
History of bilateral renal artery stenosis with with recent Doppler performed 06/25/2019 revealing mild progression of disease with a left renal aortic ratio of 4 although his blood pressure is well controlled on his current medications.  We will continue to follow this noninvasively.

## 2019-11-20 ENCOUNTER — Telehealth: Payer: Self-pay

## 2019-11-20 NOTE — Telephone Encounter (Signed)
Called and LVM for pt to call back regarding lab results.

## 2019-11-20 NOTE — Telephone Encounter (Signed)
-----   Message from Lorretta Harp, MD sent at 11/19/2019 12:46 PM EST ----- Not at goal for secondary prevention.  Increase Crestor from 2.5 to 5 mg 3 times a week and recheck in 2 months.

## 2019-11-26 DIAGNOSIS — R040 Epistaxis: Secondary | ICD-10-CM | POA: Diagnosis not present

## 2019-12-15 DIAGNOSIS — J309 Allergic rhinitis, unspecified: Secondary | ICD-10-CM | POA: Diagnosis not present

## 2019-12-15 DIAGNOSIS — J019 Acute sinusitis, unspecified: Secondary | ICD-10-CM | POA: Diagnosis not present

## 2019-12-15 DIAGNOSIS — Z681 Body mass index (BMI) 19 or less, adult: Secondary | ICD-10-CM | POA: Diagnosis not present

## 2020-01-14 DIAGNOSIS — Z6827 Body mass index (BMI) 27.0-27.9, adult: Secondary | ICD-10-CM | POA: Diagnosis not present

## 2020-01-14 DIAGNOSIS — M5412 Radiculopathy, cervical region: Secondary | ICD-10-CM | POA: Diagnosis not present

## 2020-01-14 DIAGNOSIS — M5030 Other cervical disc degeneration, unspecified cervical region: Secondary | ICD-10-CM | POA: Diagnosis not present

## 2020-01-14 DIAGNOSIS — E663 Overweight: Secondary | ICD-10-CM | POA: Diagnosis not present

## 2020-02-11 DIAGNOSIS — Z136 Encounter for screening for cardiovascular disorders: Secondary | ICD-10-CM | POA: Diagnosis not present

## 2020-02-11 DIAGNOSIS — Z87891 Personal history of nicotine dependence: Secondary | ICD-10-CM | POA: Diagnosis not present

## 2020-03-15 DIAGNOSIS — Z122 Encounter for screening for malignant neoplasm of respiratory organs: Secondary | ICD-10-CM | POA: Diagnosis not present

## 2020-03-15 DIAGNOSIS — Z87891 Personal history of nicotine dependence: Secondary | ICD-10-CM | POA: Diagnosis not present

## 2020-05-03 ENCOUNTER — Telehealth: Payer: Self-pay | Admitting: *Deleted

## 2020-05-03 NOTE — Telephone Encounter (Signed)
A message was left, re: his follow up visit. 

## 2020-06-22 ENCOUNTER — Ambulatory Visit (HOSPITAL_COMMUNITY)
Admission: RE | Admit: 2020-06-22 | Payer: Medicare Other | Source: Ambulatory Visit | Attending: Cardiovascular Disease | Admitting: Cardiovascular Disease

## 2020-07-01 ENCOUNTER — Telehealth: Payer: Self-pay | Admitting: Emergency Medicine

## 2020-07-01 ENCOUNTER — Ambulatory Visit
Admission: EM | Admit: 2020-07-01 | Discharge: 2020-07-01 | Disposition: A | Discharge status: Home / Self Care | Payer: Medicare Other | Attending: Emergency Medicine | Admitting: Emergency Medicine

## 2020-07-01 ENCOUNTER — Other Ambulatory Visit: Payer: Self-pay

## 2020-07-01 DIAGNOSIS — J0140 Acute pansinusitis, unspecified: Secondary | ICD-10-CM

## 2020-07-01 DIAGNOSIS — Z20822 Contact with and (suspected) exposure to covid-19: Secondary | ICD-10-CM

## 2020-07-01 DIAGNOSIS — R0981 Nasal congestion: Secondary | ICD-10-CM

## 2020-07-01 DIAGNOSIS — J069 Acute upper respiratory infection, unspecified: Secondary | ICD-10-CM

## 2020-07-01 MED ORDER — PREDNISONE 20 MG PO TABS: 20.0000 mg | ORAL_TABLET | Freq: Two times a day (BID) | ORAL | 0 refills | Status: DC

## 2020-07-01 MED ORDER — AMOXICILLIN-POT CLAVULANATE 875-125 MG PO TABS: 1.0000 | ORAL_TABLET | Freq: Two times a day (BID) | ORAL | 0 refills | Status: AC

## 2020-07-01 MED ORDER — PREDNISONE 20 MG PO TABS: 20.0000 mg | ORAL_TABLET | Freq: Two times a day (BID) | ORAL | 0 refills | Status: AC

## 2020-07-01 MED ORDER — AMOXICILLIN-POT CLAVULANATE 875-125 MG PO TABS: 1.0000 | ORAL_TABLET | Freq: Two times a day (BID) | ORAL | 0 refills | Status: DC

## 2020-07-01 NOTE — Telephone Encounter (Signed)
RX printed 

## 2020-07-01 NOTE — ED Triage Notes (Signed)
p presents with c/o nasal congestion and fever for past 3 days, has taken 3 home test and were negative

## 2020-07-01 NOTE — Discharge Instructions (Signed)
Declines covid test Get plenty of rest and push fluids Prednisone prescribed for nasal congestion Use OTC zyrtec for nasal congestion, runny nose, and/or sore throat Use OTC flonase for nasal congestion and runny nose Use medications daily for symptom relief Use OTC medications like ibuprofen or tylenol as needed fever or pain Call or go to the ED if you have any new or worsening symptoms such as fever, cough, shortness of breath, chest tightness, chest pain, turning blue, changes in mental status, etc..Marland Kitchen

## 2020-07-01 NOTE — ED Provider Notes (Signed)
[place]   [id number] 07/01/20 Arrival Time: 6073   CC: sinus congestion  SUBJECTIVE: History from: patient.  Daniel [name] is a 64 y.o. male who presents with nasal congestion, sinus pain/ pressure, and fever, x 1 week.  Denies sick exposure.  Has tried OTC medications without relief.  Symptoms are made worse at night.  Denies previous covid infection in the past.  Received COVID vaccine.  Denies fever, chills, SOB, wheezing, chest pain, nausea, changes in bowel or bladder habits.     ROS: As per HPI.  All other pertinent ROS negative.     Past Medical History:  Diagnosis Date  . Anxiety   . Bilateral renal artery stenosis ([place])   . History of tobacco abuse   . Hyperlipidemia   . Hypertension   . Hypothyroidism   . Palpitations   . Thyroid disease    Past Surgical History:  Procedure Laterality Date  . COLONOSCOPY N/A 07/11/2016   Procedure: COLONOSCOPY;  Surgeon: Rogene Houston, MD;  [facility]: [facility];  Service: Endoscopy;  Laterality: N/A;  930  . MENISCUS REPAIR Left 05/29/13  . POLYPECTOMY  07/11/2016   Procedure: POLYPECTOMY;  Surgeon: Rogene Houston, MD;  Location: [facility];  Service: Endoscopy;;  colon  . US ECHOCARDIOGRAPHY  07/18/2012   No Known Allergies No current facility-administered medications on file prior to encounter.   Current Outpatient Medications on File Prior to Encounter  Medication Sig Dispense Refill  . escitalopram (LEXAPRO) 10 MG tablet Take 10 mg by mouth at bedtime.     Marland Kitchen ibuprofen (ADVIL,MOTRIN) 200 MG tablet Take 200-400 mg by mouth every 6 (six) hours as needed (For pain/arthritis.).    Marland Kitchen levothyroxine (SYNTHROID, LEVOTHROID) 125 MCG tablet Take 62.5-125 mcg by mouth as directed. 62.5 MCG (ONE-HALF TABLET) ON Thursday AND Friday, 125 MCG (ONE TABLET) ON ALL OTHER DAYS.    Marland Kitchen nebivolol (BYSTOLIC) 5 MG tablet Take 1 tablet (5 mg total) by mouth every morning. 30 tablet 6  . rosuvastatin (CRESTOR) 5 MG tablet Take  2.5 mg 3 times a week 90 tablet 1  . Testosterone 30 MG MISC Place inside cheek.    . valsartan-hydrochlorothiazide (DIOVAN HCT) 80-12.5 MG tablet Take 0.5 tablets by mouth daily. 45 tablet 3   Social History   Socioeconomic History  . Marital status: Married    Spouse name: Not on file  . Number of children: Not on file  . Years of education: Not on file  . Highest education level: Not on file  Occupational History  . Not on file  Tobacco Use  . Smoking status: Former Smoker    Packs/day: 2.00    Years: 40.00    Pack years: 80.00    Types: Cigarettes    Quit date: 12/16/2010    Years since quitting: 9.5  . Smokeless tobacco: Current User    Types: Snuff  Substance and Sexual Activity  . Alcohol use: Yes    Alcohol/week: 28.0 standard drinks    Types: 28 Standard drinks or equivalent per week    Comment: daily  . Drug use: No  . Sexual activity: Not on file  Other Topics Concern  . Not on file  Social History Narrative  . Not on file   Social Determinants of Health   Financial Resource Strain:   . Difficulty of Paying Living Expenses: Not on file  Food Insecurity:   . Worried About Charity fundraiser in the Last Year:  Not on file  . Ran Out of Food in the Last Year: Not on file  Transportation Needs:   . Lack of Transportation (Medical): Not on file  . Lack of Transportation (Non-Medical): Not on file  Physical Activity:   . Days of Exercise per Week: Not on file  . Minutes of Exercise per Session: Not on file  Stress:   . Feeling of Stress : Not on file  Social Connections:   . Frequency of Communication with Friends and Family: Not on file  . Frequency of Social Gatherings with Friends and Family: Not on file  . Attends Religious Services: Not on file  . Active Member of Clubs or Organizations: Not on file  . Attends Archivist Meetings: Not on file  . Marital Status: Not on file  Intimate Partner Violence:   . Fear of Current or Ex-Partner: Not  on file  . Emotionally Abused: Not on file  . Physically Abused: Not on file  . Sexually Abused: Not on file   Family History  Problem Relation Age of Onset  . Hyperlipidemia Father   . Hypertension Father   . Stroke Father   . CAD Maternal Grandmother   . Heart attack Maternal Grandfather   . Cancer Mother   . Diabetes Mother   . Hypertension Mother     OBJECTIVE:  Vitals:   07/01/20 0913  BP: (!) 149/88  Pulse: (!) 51  Resp: 20  Temp: 97.9 F (36.6 C)  SpO2: 97%     General appearance: alert; appears fatigued, but nontoxic; speaking in full sentences and tolerating own secretions HEENT: NCAT; Ears: EACs clear, TMs pearly gray; Eyes: PERRL.  EOM grossly intact. Sinuses: TTP; Nose: nares patent without rhinorrhea, Throat: oropharynx clear, tonsils non erythematous or enlarged, uvula midline  Neck: supple without LAD Lungs: unlabored respirations, symmetrical air entry; cough: absent; no respiratory distress; CTAB Heart: regular rate and rhythm.   Skin: warm and dry Psychological: alert and cooperative; normal mood and affect   ASSESSMENT & PLAN:  1. Sinus congestion   2. Viral URI   3. Suspected COVID-19 virus infection     Meds ordered this encounter  Medications  . amoxicillin-clavulanate (AUGMENTIN) 875-125 MG tablet    Sig: Take 1 tablet by mouth every 12 (twelve) hours for 10 days.    Dispense:  20 tablet    Refill:  0    Order Specific Question:   Supervising Provider    Answer:   Raylene Everts [[id number]]  . predniSONE (DELTASONE) 20 MG tablet    Sig: Take 1 tablet (20 mg total) by mouth 2 (two) times daily with a meal for 5 days.    Dispense:  10 tablet    Refill:  0    Order Specific Question:   Supervising Provider    Answer:   Raylene Everts [[id number]]    Declines covid test Get plenty of rest and push fluids Prednisone prescribed for nasal congestion Use OTC zyrtec for nasal congestion, runny nose, and/or sore throat Use OTC flonase  for nasal congestion and runny nose Use medications daily for symptom relief Use OTC medications like ibuprofen or tylenol as needed fever or pain Call or go to the ED if you have any new or worsening symptoms such as fever, cough, shortness of breath, chest tightness, chest pain, turning blue, changes in mental status, etc...   Augmentin prescribed.  Take as directed and to completion.    Reviewed expectations re:  course of current medical issues. Questions answered. Outlined signs and symptoms indicating need for more acute intervention. Patient verbalized understanding. After Visit Summary given.         Lestine Box, PA-C 07/01/20 [phone]

## 2020-07-19 ENCOUNTER — Ambulatory Visit (HOSPITAL_COMMUNITY)
Admission: RE | Admit: 2020-07-19 | Discharge: 2020-07-19 | Disposition: A | Discharge status: Home / Self Care | Payer: Medicare Other | Source: Ambulatory Visit | Attending: Pulmonary Disease | Admitting: Pulmonary Disease

## 2020-07-19 ENCOUNTER — Other Ambulatory Visit: Payer: Self-pay

## 2020-07-19 ENCOUNTER — Other Ambulatory Visit: Payer: Self-pay | Admitting: Physician Assistant

## 2020-07-19 ENCOUNTER — Ambulatory Visit
Admission: EM | Admit: 2020-07-19 | Discharge: 2020-07-19 | Disposition: A | Discharge status: Home / Self Care | Payer: Medicare Other | Attending: Emergency Medicine | Admitting: Emergency Medicine

## 2020-07-19 DIAGNOSIS — I10 Essential (primary) hypertension: Secondary | ICD-10-CM

## 2020-07-19 DIAGNOSIS — U071 COVID-19: Secondary | ICD-10-CM

## 2020-07-19 DIAGNOSIS — J069 Acute upper respiratory infection, unspecified: Secondary | ICD-10-CM | POA: Diagnosis not present

## 2020-07-19 DIAGNOSIS — Z1152 Encounter for screening for COVID-19: Secondary | ICD-10-CM | POA: Diagnosis not present

## 2020-07-19 MED ORDER — METHYLPREDNISOLONE SODIUM SUCC 125 MG IJ SOLR: 125.0000 mg | Freq: Once | INTRAMUSCULAR | Status: DC | PRN

## 2020-07-19 MED ORDER — FAMOTIDINE IN NACL 20-0.9 MG/50ML-% IV SOLN: 20.0000 mg | Freq: Once | INTRAVENOUS | Status: DC | PRN

## 2020-07-19 MED ORDER — EPINEPHRINE 0.3 MG/0.3ML IJ SOAJ: 0.3000 mg | Freq: Once | INTRAMUSCULAR | Status: DC | PRN

## 2020-07-19 MED ORDER — FLUTICASONE PROPIONATE 50 MCG/ACT NA SUSP: 1.0000 | Freq: Every day | NASAL | 0 refills | Status: DC

## 2020-07-19 MED ORDER — SOTROVIMAB 500 MG/8ML IV SOLN
500.0000 mg | Freq: Once | INTRAVENOUS | Status: AC
  Administered 2020-07-19: 500 mg via INTRAVENOUS

## 2020-07-19 MED ORDER — ALBUTEROL SULFATE HFA 108 (90 BASE) MCG/ACT IN AERS: 2.0000 | INHALATION_SPRAY | Freq: Once | RESPIRATORY_TRACT | Status: DC | PRN

## 2020-07-19 MED ORDER — BENZONATATE 100 MG PO CAPS: 100.0000 mg | ORAL_CAPSULE | Freq: Three times a day (TID) | ORAL | 0 refills | Status: DC

## 2020-07-19 MED ORDER — DEXAMETHASONE 4 MG PO TABS: 4.0000 mg | ORAL_TABLET | Freq: Every day | ORAL | 0 refills | Status: AC

## 2020-07-19 MED ORDER — SODIUM CHLORIDE 0.9 % IV SOLN: INTRAVENOUS | Status: DC | PRN

## 2020-07-19 MED ORDER — DIPHENHYDRAMINE HCL 50 MG/ML IJ SOLN: 50.0000 mg | Freq: Once | INTRAMUSCULAR | Status: DC | PRN

## 2020-07-19 MED ORDER — CETIRIZINE HCL 10 MG PO TABS: 10.0000 mg | ORAL_TABLET | Freq: Every day | ORAL | 0 refills | Status: DC

## 2020-07-19 NOTE — ED Triage Notes (Signed)
Pt presents with nasal congestion for past few days, home covid test positive

## 2020-07-19 NOTE — Progress Notes (Signed)
Diagnosis: COVID-19  Physician:Dr. Joya Gaskins  Procedure:[facility] HDI:[id number] - Provided patient with Sotrovimabfact sheet for patients, parents and caregivers prior to infusion.   Complications:No immediate complications  Discharge: Discharged home

## 2020-07-19 NOTE — Discharge Instructions (Signed)
COVID testing ordered.  It will take between 2-7 days for test results.  Someone will contact you regarding abnormal results.    In the meantime: You should remain isolated in your home for 10 days from symptom onset AND greater than 72 hours after symptoms resolution (absence of fever without the use of fever-reducing medication and improvement in respiratory symptoms), whichever is longer Get plenty of rest and push fluids Tessalon Perles prescribed for cough Zyrtec for nasal congestion, runny nose, and/or sore throat Flonase for nasal congestion and runny nose Decadron prescribed Use medications daily for symptom relief Use OTC medications like ibuprofen or tylenol as needed fever or pain Call or go to the ED if you have any new or worsening symptoms such as fever, worsening cough, shortness of breath, chest tightness, chest pain, turning blue, changes in mental status, etc..Marland Kitchen

## 2020-07-19 NOTE — ED Provider Notes (Signed)
[place]   [id number] 07/19/20 Arrival Time: 1914   CC: COVID symptoms  SUBJECTIVE: History from: patient.  Daniel [name] is a 64 y.o. male who presents presented to the urgent care for complaint of nasal congestion and cough for the past 1 week.  Report he tested positive via home COVID-19 test.  Denies recent travel.  Has tried OTC medication without relief.  Denies aggravating factors.  Denies previous symptoms in the past.   Denies fever, chills, fatigue, sinus pain, rhinorrhea, sore throat, SOB, wheezing, chest pain, nausea, changes in bowel or bladder habits.     ROS: As per HPI.  All other pertinent ROS negative.      Past Medical History:  Diagnosis Date  . Anxiety   . Bilateral renal artery stenosis ([place])   . History of tobacco abuse   . Hyperlipidemia   . Hypertension   . Hypothyroidism   . Palpitations   . Thyroid disease    Past Surgical History:  Procedure Laterality Date  . COLONOSCOPY N/A 07/11/2016   Procedure: COLONOSCOPY;  Surgeon: Rogene Houston, MD;  [facility];  Service: Endoscopy;  Laterality: N/A;  930  . MENISCUS REPAIR Left 05/29/13  . POLYPECTOMY  07/11/2016   Procedure: POLYPECTOMY;  Surgeon: Rogene Houston, MD;  [facility];  Service: Endoscopy;;  colon  . US ECHOCARDIOGRAPHY  07/18/2012   No Known Allergies No current facility-administered medications on file prior to encounter.   Current Outpatient Medications on File Prior to Encounter  Medication Sig Dispense Refill  . escitalopram (LEXAPRO) 10 MG tablet Take 10 mg by mouth at bedtime.     Marland Kitchen ibuprofen (ADVIL,MOTRIN) 200 MG tablet Take 200-400 mg by mouth every 6 (six) hours as needed (For pain/arthritis.).    Marland Kitchen levothyroxine (SYNTHROID, LEVOTHROID) 125 MCG tablet Take 62.5-125 mcg by mouth as directed. 62.5 MCG (ONE-HALF TABLET) ON Thursday AND Friday, 125 MCG (ONE TABLET) ON ALL OTHER DAYS.    Marland Kitchen nebivolol (BYSTOLIC) 5 MG tablet Take 1 tablet (5 mg  total) by mouth every morning. 30 tablet 6  . rosuvastatin (CRESTOR) 5 MG tablet Take 2.5 mg 3 times a week 90 tablet 1  . Testosterone 30 MG MISC Place inside cheek.    . valsartan-hydrochlorothiazide (DIOVAN HCT) 80-12.5 MG tablet Take 0.5 tablets by mouth daily. 45 tablet 3   Social History   Socioeconomic History  . Marital status: Married    Spouse name: Not on file  . Number of children: Not on file  . Years of education: Not on file  . Highest education level: Not on file  Occupational History  . Not on file  Tobacco Use  . Smoking status: Former Smoker    Packs/day: 2.00    Years: 40.00    Pack years: 80.00    Types: Cigarettes    Quit date: 12/16/2010    Years since quitting: 9.5  . Smokeless tobacco: Current User    Types: Snuff  Substance and Sexual Activity  . Alcohol use: Yes    Alcohol/week: 28.0 standard drinks    Types: 28 Standard drinks or equivalent per week    Comment: daily  . Drug use: No  . Sexual activity: Not on file  Other Topics Concern  . Not on file  Social History Narrative  . Not on file   Social Determinants of Health   Financial Resource Strain:   . Difficulty of Paying Living Expenses: Not on file  Food Insecurity:   . Worried About Charity fundraiser in the Last Year: Not on file  . Ran Out of Food in the Last Year: Not on file  Transportation Needs:   . Lack of Transportation (Medical): Not on file  . Lack of Transportation (Non-Medical): Not on file  Physical Activity:   . Days of Exercise per Week: Not on file  . Minutes of Exercise per Session: Not on file  Stress:   . Feeling of Stress : Not on file  Social Connections:   . Frequency of Communication with Friends and Family: Not on file  . Frequency of Social Gatherings with Friends and Family: Not on file  . Attends Religious Services: Not on file  . Active Member of Clubs or Organizations: Not on file  . Attends Archivist Meetings: Not on file  . Marital  Status: Not on file  Intimate Partner Violence:   . Fear of Current or Ex-Partner: Not on file  . Emotionally Abused: Not on file  . Physically Abused: Not on file  . Sexually Abused: Not on file   Family History  Problem Relation Age of Onset  . Hyperlipidemia Father   . Hypertension Father   . Stroke Father   . CAD Maternal Grandmother   . Heart attack Maternal Grandfather   . Cancer Mother   . Diabetes Mother   . Hypertension Mother     OBJECTIVE:  Vitals:   07/19/20 0845  BP: (!) 177/86  Pulse: 89  Resp: 18  Temp: 99.1 F (37.3 C)  SpO2: 97%     General appearance: alert; appears fatigued, but nontoxic; speaking in full sentences and tolerating own secretions HEENT: NCAT; Ears: EACs clear, TMs pearly gray; Eyes: PERRL.  EOM grossly intact. Sinuses: nontender; Nose: nares patent without rhinorrhea, Throat: oropharynx clear, tonsils non erythematous or enlarged, uvula midline  Neck: supple without LAD Lungs: unlabored respirations, symmetrical air entry; cough: moderate; no respiratory distress; CTAB Heart: regular rate and rhythm.  Radial pulses 2+ symmetrical bilaterally Skin: warm and dry Psychological: alert and cooperative; normal mood and affect  LABS:  No results found for this or any previous visit (from the past 24 hour(s)).   ASSESSMENT & PLAN:  1. Encounter for screening for COVID-19   2. URI with cough and congestion     Meds ordered this encounter  Medications  . cetirizine (ZYRTEC ALLERGY) 10 MG tablet    Sig: Take 1 tablet (10 mg total) by mouth daily.    Dispense:  30 tablet    Refill:  0  . fluticasone (FLONASE) 50 MCG/ACT nasal spray    Sig: Place 1 spray into both nostrils daily for 14 days.    Dispense:  16 g    Refill:  0  . dexamethasone (DECADRON) 4 MG tablet    Sig: Take 1 tablet (4 mg total) by mouth daily for 7 days.    Dispense:  7 tablet    Refill:  0  . benzonatate (TESSALON) 100 MG capsule    Sig: Take 1 capsule (100 mg  total) by mouth every 8 (eight) hours.    Dispense:  30 capsule    Refill:  0       COVID testing ordered.  It will take between 2-7 days for test results.  Someone will contact you regarding abnormal results.    In the meantime: You should remain isolated in your home for 10 days from symptom onset AND greater  than 72 hours after symptoms resolution (absence of fever without the use of fever-reducing medication and improvement in respiratory symptoms), whichever is longer Get plenty of rest and push fluids Tessalon Perles prescribed for cough Zyrtec for nasal congestion, runny nose, and/or sore throat Flonase for nasal congestion and runny nose Decadron prescribed Use medications daily for symptom relief Use OTC medications like ibuprofen or tylenol as needed fever or pain Call or go to the ED if you have any new or worsening symptoms such as fever, worsening cough, shortness of breath, chest tightness, chest pain, turning blue, changes in mental status, etc...   Reviewed expectations re: course of current medical issues. Questions answered. Outlined signs and symptoms indicating need for more acute intervention. Patient verbalized understanding. After Visit Summary given.         Emerson Monte, [place] 07/19/20 [phone]

## 2020-07-19 NOTE — Discharge Instructions (Signed)

## 2020-07-19 NOTE — Progress Notes (Signed)
I connected by phone with Daniel [name] on 07/19/2020 at 1:37 PM to discuss the potential use of a new treatment for mild to moderate COVID-19 viral infection in non-hospitalized patients.  This patient is a 64 y.o. male that meets the FDA criteria for Emergency Use Authorization of COVID monoclonal antibody sotrovimab, casirivimab/imdevimab or bamlamivimab/estevimab.  Has a (+) direct SARS-CoV-2 viral test result  Has mild or moderate COVID-19   Is NOT hospitalized due to COVID-19  Is within 10 days of symptom onset  Has at least one of the high risk factor(s) for progression to severe COVID-19 and/or hospitalization as defined in EUA.  Specific high risk criteria : Older age (>/= 66 yo) and Cardiovascular disease or hypertension   I have spoken and communicated the following to the patient or parent/caregiver regarding COVID monoclonal antibody treatment:  1. FDA has authorized the emergency use for the treatment of mild to moderate COVID-19 in adults and pediatric patients with positive results of direct SARS-CoV-2 viral testing who are 3 years of age and older weighing at least 40 kg, and who are at high risk for progressing to severe COVID-19 and/or hospitalization.  2. The significant known and potential risks and benefits of COVID monoclonal antibody, and the extent to which such potential risks and benefits are unknown.  3. Information on available alternative treatments and the risks and benefits of those alternatives, including clinical trials.  4. Patients treated with COVID monoclonal antibody should continue to self-isolate and use infection control measures (e.g., wear mask, isolate, social distance, avoid sharing personal items, clean and disinfect "high touch" surfaces, and frequent handwashing) according to CDC guidelines.   5. The patient or parent/caregiver has the option to accept or refuse COVID monoclonal antibody treatment.  After reviewing this information with  the patient, the patient has agreed to receive one of the available covid 19 monoclonal antibodies and will be provided an appropriate fact sheet prior to infusion.  Sx onset 10/26. Set up for infusion on 11/2 @ 3:30pm. Directions given to [facility]. Pt is aware that insurance will be charged an infusion fee. Pt is fully vaccinated.   Angelena Form 07/19/2020 1:37 PM

## 2020-07-20 ENCOUNTER — Ambulatory Visit (HOSPITAL_COMMUNITY): Payer: Medicare Other

## 2020-07-20 LAB — SARS-COV-2, NAA 2 DAY TAT

## 2020-07-20 LAB — NOVEL CORONAVIRUS, NAA: SARS-CoV-2, NAA: DETECTED — AB

## 2020-08-17 ENCOUNTER — Ambulatory Visit
Admission: EM | Admit: 2020-08-17 | Discharge: 2020-08-17 | Disposition: A | Discharge status: Home / Self Care | Payer: Medicare Other

## 2020-08-17 ENCOUNTER — Encounter: Payer: Self-pay | Admitting: Emergency Medicine

## 2020-08-17 ENCOUNTER — Other Ambulatory Visit: Payer: Self-pay

## 2020-08-17 DIAGNOSIS — J069 Acute upper respiratory infection, unspecified: Secondary | ICD-10-CM | POA: Diagnosis not present

## 2020-08-17 NOTE — Discharge Instructions (Signed)
Get plenty of rest and push fluids Continue with tessalon Perles prescribed for cough Use OTC zyrtec for nasal congestion, runny nose, and/or sore throat Use OTC flonase for nasal congestion and runny nose Use medications daily for symptom relief Use OTC medications like ibuprofen or tylenol as needed fever or pain Follow up with PCP next week as needed Call or go to the ED if you have any new or worsening symptoms such as fever, worsening cough, shortness of breath, chest tightness, chest pain, turning blue, changes in mental status, etc..Marland Kitchen

## 2020-08-17 NOTE — ED Triage Notes (Signed)
Runny nose, cough and congestion x 1 day.  covid + x 1 month ago

## 2020-08-17 NOTE — ED Provider Notes (Signed)
[name]   [id number] 08/17/20 Arrival Time: 2423   CC: URI symptoms  SUBJECTIVE: History from: patient.  Daniel [name] is a 64 y.o. male who presents with runny nose, congestion, sore throat, and cough x 1 day.  Denies sick exposure to COVID, flu or strep.  Reports he tested positive for covid apx 1 month ago.  Has tried OTC mediations with minimal relief.  Denies aggravating factors.  Denies fever, chills, SOB, wheezing, chest pain, nausea, changes in bowel or bladder habits.    ROS: As per HPI.  All other pertinent ROS negative.     Past Medical History:  Diagnosis Date  . Anxiety   . Bilateral renal artery stenosis ([place])   . History of tobacco abuse   . Hyperlipidemia   . Hypertension   . Hypothyroidism   . Palpitations   . Thyroid disease    Past Surgical History:  Procedure Laterality Date  . COLONOSCOPY N/A 07/11/2016   Procedure: COLONOSCOPY;  Surgeon: Rogene Houston, MD;  [facility];  Service: Endoscopy;  Laterality: N/A;  930  . MENISCUS REPAIR Left 05/29/13  . POLYPECTOMY  07/11/2016   Procedure: POLYPECTOMY;  Surgeon: Rogene Houston, MD;  Location: [facility];  Service: Endoscopy;;  colon  . US ECHOCARDIOGRAPHY  07/18/2012   No Known Allergies No current facility-administered medications on file prior to encounter.   Current Outpatient Medications on File Prior to Encounter  Medication Sig Dispense Refill  . benzonatate (TESSALON) 100 MG capsule Take 1 capsule (100 mg total) by mouth every 8 (eight) hours. 30 capsule 0  . cetirizine (ZYRTEC ALLERGY) 10 MG tablet Take 1 tablet (10 mg total) by mouth daily. 30 tablet 0  . escitalopram (LEXAPRO) 10 MG tablet Take 10 mg by mouth at bedtime.     . fluticasone (FLONASE) 50 MCG/ACT nasal spray Place 1 spray into both nostrils daily for 14 days. 16 g 0  . ibuprofen (ADVIL,MOTRIN) 200 MG tablet Take 200-400 mg by mouth every 6 (six) hours as needed (For pain/arthritis.).    Marland Kitchen  levothyroxine (SYNTHROID, LEVOTHROID) 125 MCG tablet Take 62.5-125 mcg by mouth as directed. 62.5 MCG (ONE-HALF TABLET) ON Thursday AND Friday, 125 MCG (ONE TABLET) ON ALL OTHER DAYS.    Marland Kitchen nebivolol (BYSTOLIC) 5 MG tablet Take 1 tablet (5 mg total) by mouth every morning. 30 tablet 6  . rosuvastatin (CRESTOR) 5 MG tablet Take 2.5 mg 3 times a week 90 tablet 1  . Testosterone 30 MG MISC Place inside cheek.    . valsartan-hydrochlorothiazide (DIOVAN HCT) 80-12.5 MG tablet Take 0.5 tablets by mouth daily. 45 tablet 3   Social History   Socioeconomic History  . Marital status: Married    Spouse name: Not on file  . Number of children: Not on file  . Years of education: Not on file  . Highest education level: Not on file  Occupational History  . Not on file  Tobacco Use  . Smoking status: Former Smoker    Packs/day: 2.00    Years: 40.00    Pack years: 80.00    Types: Cigarettes    Quit date: 12/16/2010    Years since quitting: 9.6  . Smokeless tobacco: Current User    Types: Snuff  Substance and Sexual Activity  . Alcohol use: Yes    Alcohol/week: 28.0 standard drinks    Types: 28 Standard drinks or equivalent per week    Comment: daily  . Drug use:  No  . Sexual activity: Not on file  Other Topics Concern  . Not on file  Social History Narrative  . Not on file   Social Determinants of Health   Financial Resource Strain:   . Difficulty of Paying Living Expenses: Not on file  Food Insecurity:   . Worried About Charity fundraiser in the Last Year: Not on file  . Ran Out of Food in the Last Year: Not on file  Transportation Needs:   . Lack of Transportation (Medical): Not on file  . Lack of Transportation (Non-Medical): Not on file  Physical Activity:   . Days of Exercise per Week: Not on file  . Minutes of Exercise per Session: Not on file  Stress:   . Feeling of Stress : Not on file  Social Connections:   . Frequency of Communication with Friends and Family: Not on file   . Frequency of Social Gatherings with Friends and Family: Not on file  . Attends Religious Services: Not on file  . Active Member of Clubs or Organizations: Not on file  . Attends Archivist Meetings: Not on file  . Marital Status: Not on file  Intimate Partner Violence:   . Fear of Current or Ex-Partner: Not on file  . Emotionally Abused: Not on file  . Physically Abused: Not on file  . Sexually Abused: Not on file   Family History  Problem Relation Age of Onset  . Hyperlipidemia Father   . Hypertension Father   . Stroke Father   . CAD Maternal Grandmother   . Heart attack Maternal Grandfather   . Cancer Mother   . Diabetes Mother   . Hypertension Mother     OBJECTIVE:  Vitals:   08/17/20 0905  BP: (!) 145/82  Pulse: 78  Resp: 16  Temp: 98 F (36.7 C)  TempSrc: Oral  SpO2: 96%     General appearance: alert; appears mildly fatigued, but nontoxic; speaking in full sentences and tolerating own secretions HEENT: NCAT; Ears: EACs clear, TMs pearly gray; Eyes: PERRL.  EOM grossly intact. Nose: nares patent without rhinorrhea, Throat: oropharynx clear, tonsils non erythematous or enlarged, uvula midline  Neck: supple without LAD Lungs: unlabored respirations, symmetrical air entry; cough: mild; no respiratory distress; CTAB Heart: regular rate and rhythm.  Skin: warm and dry Psychological: alert and cooperative; normal mood and affect  ASSESSMENT & PLAN:  1. Viral URI with cough    Get plenty of rest and push fluids Continue with tessalon Perles prescribed for cough Use OTC zyrtec for nasal congestion, runny nose, and/or sore throat Use OTC flonase for nasal congestion and runny nose Use medications daily for symptom relief Use OTC medications like ibuprofen or tylenol as needed fever or pain Follow up with PCP next week as needed Call or go to the ED if you have any new or worsening symptoms such as fever, worsening cough, shortness of breath, chest  tightness, chest pain, turning blue, changes in mental status, etc...   Reviewed expectations re: course of current medical issues. Questions answered. Outlined signs and symptoms indicating need for more acute intervention. Patient verbalized understanding. After Visit Summary given.         Lestine Box, PA-C 08/17/20 [phone]

## 2020-08-21 ENCOUNTER — Ambulatory Visit
Admission: EM | Admit: 2020-08-21 | Discharge: 2020-08-21 | Disposition: A | Discharge status: Home / Self Care | Payer: Medicare Other | Attending: Emergency Medicine | Admitting: Emergency Medicine

## 2020-08-21 ENCOUNTER — Other Ambulatory Visit: Payer: Self-pay

## 2020-08-21 DIAGNOSIS — J0100 Acute maxillary sinusitis, unspecified: Secondary | ICD-10-CM | POA: Diagnosis not present

## 2020-08-21 MED ORDER — PREDNISONE 10 MG PO TABS: 20.0000 mg | ORAL_TABLET | Freq: Every day | ORAL | 0 refills | Status: DC

## 2020-08-21 MED ORDER — DEXAMETHASONE SODIUM PHOSPHATE 10 MG/ML IJ SOLN
10.0000 mg | Freq: Once | INTRAMUSCULAR | Status: AC
  Administered 2020-08-21: 10 mg via INTRAMUSCULAR

## 2020-08-21 MED ORDER — AMOXICILLIN-POT CLAVULANATE 875-125 MG PO TABS: 1.0000 | ORAL_TABLET | Freq: Two times a day (BID) | ORAL | 0 refills | Status: DC

## 2020-08-21 NOTE — Discharge Instructions (Signed)
Get plenty of rest and push fluids Decadron IM was given in office Continue to take [place] as prescribed for cough Augmentin was prescribed  Prednisone was prescribed/take as directed use medications daily for symptom relief Use OTC medications like ibuprofen or tylenol as needed fever or pain Call or go to the ED if you have any new or worsening symptoms such as fever, worsening cough, shortness of breath, chest tightness, chest pain, turning blue, changes in mental status, etc..Marland Kitchen

## 2020-08-21 NOTE — ED Provider Notes (Signed)
[name]   [id number] 08/21/20 Arrival Time: 1505   CC: URI  SUBJECTIVE: History from: patient.  Daniel [name] is a 64 y.o. male who presented to the urgent care for complaint of chills, fever, cough, sore throat, sinus pressure, sinus pain, nasal congestion and body aches for the past 1 week.  Report yellowish-green nasal drainage.  Denies sick exposure to COVID, flu or strep.  Denies recent travel.  Has tried [employer] without relief.  Denies any aggravating factors.  Denies previous symptoms in the past.   Denies fever, chills, fatigue,  rhinorrhea, sore throat, SOB, wheezing, chest pain, nausea, changes in bowel or bladder habits.  .      ROS: As per HPI.  All other pertinent ROS negative.      Past Medical History:  Diagnosis Date  . Anxiety   . Bilateral renal artery stenosis ([place])   . History of tobacco abuse   . Hyperlipidemia   . Hypertension   . Hypothyroidism   . Palpitations   . Thyroid disease    Past Surgical History:  Procedure Laterality Date  . COLONOSCOPY N/A 07/11/2016   Procedure: COLONOSCOPY;  Surgeon: Rogene Houston, MD;  [facility];  Service: Endoscopy;  Laterality: N/A;  930  . MENISCUS REPAIR Left 05/29/13  . POLYPECTOMY  07/11/2016   Procedure: POLYPECTOMY;  Surgeon: Rogene Houston, MD;  [facility];  Service: Endoscopy;;  colon  . US ECHOCARDIOGRAPHY  07/18/2012   No Known Allergies No current facility-administered medications on file prior to encounter.   Current Outpatient Medications on File Prior to Encounter  Medication Sig Dispense Refill  . benzonatate (TESSALON) 100 MG capsule Take 1 capsule (100 mg total) by mouth every 8 (eight) hours. 30 capsule 0  . cetirizine (ZYRTEC ALLERGY) 10 MG tablet Take 1 tablet (10 mg total) by mouth daily. 30 tablet 0  . escitalopram (LEXAPRO) 10 MG tablet Take 10 mg by mouth at bedtime.     . fluticasone (FLONASE) 50 MCG/ACT nasal spray Place 1 spray into  both nostrils daily for 14 days. 16 g 0  . ibuprofen (ADVIL,MOTRIN) 200 MG tablet Take 200-400 mg by mouth every 6 (six) hours as needed (For pain/arthritis.).    Marland Kitchen levothyroxine (SYNTHROID, LEVOTHROID) 125 MCG tablet Take 62.5-125 mcg by mouth as directed. 62.5 MCG (ONE-HALF TABLET) ON Thursday AND Friday, 125 MCG (ONE TABLET) ON ALL OTHER DAYS.    Marland Kitchen nebivolol (BYSTOLIC) 5 MG tablet Take 1 tablet (5 mg total) by mouth every morning. 30 tablet 6  . rosuvastatin (CRESTOR) 5 MG tablet Take 2.5 mg 3 times a week 90 tablet 1  . Testosterone 30 MG MISC Place inside cheek.    . valsartan-hydrochlorothiazide (DIOVAN HCT) 80-12.5 MG tablet Take 0.5 tablets by mouth daily. 45 tablet 3   Social History   Socioeconomic History  . Marital status: Married    Spouse name: Not on file  . Number of children: Not on file  . Years of education: Not on file  . Highest education level: Not on file  Occupational History  . Not on file  Tobacco Use  . Smoking status: Former Smoker    Packs/day: 2.00    Years: 40.00    Pack years: 80.00    Types: Cigarettes    Quit date: 12/16/2010    Years since quitting: 9.6  . Smokeless tobacco: Current User    Types: Snuff  Substance and Sexual Activity  .  Alcohol use: Yes    Alcohol/week: 28.0 standard drinks    Types: 28 Standard drinks or equivalent per week    Comment: daily  . Drug use: No  . Sexual activity: Not on file  Other Topics Concern  . Not on file  Social History Narrative  . Not on file   Social Determinants of Health   Financial Resource Strain:   . Difficulty of Paying Living Expenses: Not on file  Food Insecurity:   . Worried About Charity fundraiser in the Last Year: Not on file  . Ran Out of Food in the Last Year: Not on file  Transportation Needs:   . Lack of Transportation (Medical): Not on file  . Lack of Transportation (Non-Medical): Not on file  Physical Activity:   . Days of Exercise per Week: Not on file  . Minutes of  Exercise per Session: Not on file  Stress:   . Feeling of Stress : Not on file  Social Connections:   . Frequency of Communication with Friends and Family: Not on file  . Frequency of Social Gatherings with Friends and Family: Not on file  . Attends Religious Services: Not on file  . Active Member of Clubs or Organizations: Not on file  . Attends Archivist Meetings: Not on file  . Marital Status: Not on file  Intimate Partner Violence:   . Fear of Current or Ex-Partner: Not on file  . Emotionally Abused: Not on file  . Physically Abused: Not on file  . Sexually Abused: Not on file   Family History  Problem Relation Age of Onset  . Hyperlipidemia Father   . Hypertension Father   . Stroke Father   . CAD Maternal Grandmother   . Heart attack Maternal Grandfather   . Cancer Mother   . Diabetes Mother   . Hypertension Mother     OBJECTIVE:  Vitals:   08/21/20 0915  BP: 135/82  Pulse: 80  Resp: 16  Temp: 98.3 F (36.8 C)  SpO2: 98%     General appearance: alert; appears fatigued, but nontoxic; speaking in full sentences and tolerating own secretions HEENT: NCAT; Ears: EACs clear, TMs pearly gray; Eyes: PERRL.  EOM grossly intact. Sinuses: Maxillary sinus  tender; Nose: nares patent without rhinorrhea, Throat: oropharynx clear, tonsils non erythematous or enlarged, uvula midline  Neck: supple without LAD Lungs: unlabored respirations, symmetrical air entry; cough: moderate; no respiratory distress; CTAB Heart: regular rate and rhythm.  Radial pulses 2+ symmetrical bilaterally Skin: warm and dry Psychological: alert and cooperative; normal mood and affect  LABS:  No results found for this or any previous visit (from the past 24 hour(s)).   ASSESSMENT & PLAN:  1. Acute non-recurrent maxillary sinusitis     Meds ordered this encounter  Medications  . dexamethasone (DECADRON) injection 10 mg  . amoxicillin-clavulanate (AUGMENTIN) 875-125 MG tablet    Sig:  Take 1 tablet by mouth every 12 (twelve) hours.    Dispense:  14 tablet    Refill:  0  . predniSONE (DELTASONE) 10 MG tablet    Sig: Take 2 tablets (20 mg total) by mouth daily.    Dispense:  15 tablet    Refill:  0    Discharge instructions   Get plenty of rest and push fluids Decadron IM was given in office Continue to take Tessalon Perles as prescribed for cough Augmentin was prescribed  Prednisone was prescribed/take as directed use medications daily for symptom relief  Use OTC medications like ibuprofen or tylenol as needed fever or pain Call or go to the ED if you have any new or worsening symptoms such as fever, worsening cough, shortness of breath, chest tightness, chest pain, turning blue, changes in mental status, etc...   Reviewed expectations re: course of current medical issues. Questions answered. Outlined signs and symptoms indicating need for more acute intervention. Patient verbalized understanding. After Visit Summary given.         Emerson Monte, [place] 08/21/20 [phone]

## 2020-08-21 NOTE — ED Triage Notes (Signed)
Pt presents with complaints of fever, body aches, and chills x 1 week. Pt had COVID in October. Pt reports he was here last week and has been taking medication at home. Reports he is feeling worse and not better.

## 2020-08-22 ENCOUNTER — Other Ambulatory Visit (HOSPITAL_COMMUNITY): Payer: Self-pay | Admitting: Cardiovascular Disease

## 2020-08-22 ENCOUNTER — Ambulatory Visit (HOSPITAL_COMMUNITY)
Admission: RE | Admit: 2020-08-22 | Discharge: 2020-08-22 | Disposition: A | Discharge status: Home / Self Care | Payer: Medicare Other | Source: Ambulatory Visit | Attending: Cardiology | Admitting: Cardiology

## 2020-08-22 DIAGNOSIS — I701 Atherosclerosis of renal artery: Secondary | ICD-10-CM | POA: Insufficient documentation

## 2020-09-22 DIAGNOSIS — H5213 Myopia, bilateral: Secondary | ICD-10-CM | POA: Diagnosis not present

## 2020-09-25 ENCOUNTER — Encounter: Payer: Self-pay | Admitting: Emergency Medicine

## 2020-09-25 ENCOUNTER — Other Ambulatory Visit: Payer: Self-pay

## 2020-09-25 ENCOUNTER — Ambulatory Visit
Admission: EM | Admit: 2020-09-25 | Discharge: 2020-09-25 | Disposition: A | Discharge status: Home / Self Care | Payer: Medicare Other | Attending: Family Medicine | Admitting: Family Medicine

## 2020-09-25 DIAGNOSIS — R509 Fever, unspecified: Secondary | ICD-10-CM | POA: Diagnosis not present

## 2020-09-25 DIAGNOSIS — R519 Headache, unspecified: Secondary | ICD-10-CM | POA: Diagnosis not present

## 2020-09-25 MED ORDER — KETOROLAC TROMETHAMINE 30 MG/ML IJ SOLN
30.0000 mg | Freq: Once | INTRAMUSCULAR | Status: AC
  Administered 2020-09-25: 30 mg via INTRAMUSCULAR

## 2020-09-25 MED ORDER — DEXAMETHASONE SODIUM PHOSPHATE 10 MG/ML IJ SOLN
10.0000 mg | Freq: Once | INTRAMUSCULAR | Status: AC
  Administered 2020-09-25: 10 mg via INTRAMUSCULAR

## 2020-09-25 NOTE — Discharge Instructions (Addendum)
You have received a Toradol injection for pain in the office today.  You have also received a steroid injection in the office today.  I would recommend following up with Dr. Hilma Favors because you may need a CT of your head to evaluate why this pain keeps coming back  Follow up with this office or with primary care if symptoms are persisting.  Follow up in the ER for high fever, trouble swallowing, trouble breathing, other concerning symptoms.

## 2020-09-25 NOTE — ED Provider Notes (Signed)
[facility]    CSN: [id number] Arrival date & time: 09/25/20  1110      History   Chief Complaint No chief complaint on file.   HPI Daniel [name] is a 65 y.o. male.   Reports low-grade fever, body aches, recurrent head pain since he has had Covid in November 2021.  Reports that the pain surrounds his right, to the lateral aspect of the orbit.  Reports that the pain sometimes radiates to the right ear.  Denies tenderness, warmth, redness.  Has taken over-the-counter pain relievers with no benefit.  This is his third visit to the urgent care for the same type of pain.  Has been treated with antibiotics for it in the past, reports that this helps but it always returns.  Denies cough, nausea, vomiting, diarrhea, rash, other symptoms. Denies changes in vision.  ROS per HPI  The history is provided by the patient.    Past Medical History:  Diagnosis Date  . Anxiety   . Bilateral renal artery stenosis ([place])   . History of tobacco abuse   . Hyperlipidemia   . Hypertension   . Hypothyroidism   . Palpitations   . Thyroid disease     Patient Active Problem List   Diagnosis Date Noted  . Atypical chest pain 11/18/2019  . Bilateral renal artery stenosis ([place]) 10/05/2015  . Palpitations 10/26/2014  . Essential hypertension 07/17/2013  . Hyperlipidemia 07/17/2013  . Hypertension     Past Surgical History:  Procedure Laterality Date  . COLONOSCOPY N/A 07/11/2016   Procedure: COLONOSCOPY;  Surgeon: Rogene Houston, MD;  [facility];  Service: Endoscopy;  Laterality: N/A;  930  . MENISCUS REPAIR Left 05/29/13  . POLYPECTOMY  07/11/2016   Procedure: POLYPECTOMY;  Surgeon: Rogene Houston, MD;  [facility];  Service: Endoscopy;;  colon  . US ECHOCARDIOGRAPHY  07/18/2012       Home Medications    Prior to Admission medications   Medication Sig Start Date End Date Taking? Authorizing Provider  amoxicillin-clavulanate (AUGMENTIN) 875-125 MG  tablet Take 1 tablet by mouth every 12 (twelve) hours. 08/21/20   Avegno, Darrelyn Hillock, FNP  benzonatate (TESSALON) 100 MG capsule Take 1 capsule (100 mg total) by mouth every 8 (eight) hours. 07/19/20   Avegno, Darrelyn Hillock, FNP  cetirizine (ZYRTEC ALLERGY) 10 MG tablet Take 1 tablet (10 mg total) by mouth daily. 07/19/20   Avegno, Darrelyn Hillock, FNP  escitalopram (LEXAPRO) 10 MG tablet Take 10 mg by mouth at bedtime.     [provider]  fluticasone (FLONASE) 50 MCG/ACT nasal spray Place 1 spray into both nostrils daily for 14 days. 07/19/20 08/02/20  Avegno, Darrelyn Hillock, FNP  ibuprofen (ADVIL,MOTRIN) 200 MG tablet Take 200-400 mg by mouth every 6 (six) hours as needed (For pain/arthritis.).    [provider]  levothyroxine (SYNTHROID, LEVOTHROID) 125 MCG tablet Take 62.5-125 mcg by mouth as directed. 62.5 MCG (ONE-HALF TABLET) ON Thursday AND Friday, 125 MCG (ONE TABLET) ON ALL OTHER DAYS.    [provider]  nebivolol (BYSTOLIC) 5 MG tablet Take 1 tablet (5 mg total) by mouth every morning. 02/13/19   Cleaver, Jossie Ng, NP  predniSONE (DELTASONE) 10 MG tablet Take 2 tablets (20 mg total) by mouth daily. 08/21/20   Emerson Monte, FNP  rosuvastatin (CRESTOR) 5 MG tablet Take 2.5 mg 3 times a week 08/19/19   Deberah Pelton, NP  Testosterone 30 MG MISC Place inside  cheek.    [provider]  valsartan-hydrochlorothiazide (DIOVAN HCT) 80-12.5 MG tablet Take 0.5 tablets by mouth daily. 09/12/18   Lorretta Harp, MD    Family History Family History  Problem Relation Age of Onset  . Hyperlipidemia Father   . Hypertension Father   . Stroke Father   . CAD Maternal Grandmother   . Heart attack Maternal Grandfather   . Cancer Mother   . Diabetes Mother   . Hypertension Mother     Social History Social History   Tobacco Use  . Smoking status: Former Smoker    Packs/day: 2.00    Years: 40.00    Pack years: 80.00    Types: Cigarettes    Quit date: 12/16/2010     Years since quitting: 9.7  . Smokeless tobacco: Current User    Types: Snuff  Substance Use Topics  . Alcohol use: Yes    Alcohol/week: 28.0 standard drinks    Types: 28 Standard drinks or equivalent per week    Comment: daily  . Drug use: No     Allergies   Patient has no known allergies.   Review of Systems Review of Systems   Physical Exam Triage Vital Signs ED Triage Vitals  Enc Vitals Group     BP 09/25/20 1158 (!) 144/80     Pulse Rate 09/25/20 1158 92     Resp 09/25/20 1158 18     Temp 09/25/20 1158 98.4 F (36.9 C)     Temp Source 09/25/20 1158 Oral     SpO2 09/25/20 1158 95 %     Weight 09/25/20 1158 195 lb (88.5 kg)     Height 09/25/20 1158 5\' 11"  (1.803 m)     Head Circumference --      Peak Flow --      Pain Score 09/25/20 1157 4     Pain Loc --      Pain Edu? --      Excl. in [place]? --    No data found.  Updated Vital Signs BP (!) 144/80 (BP Location: Right Arm)   Pulse 92   Temp 98.4 F (36.9 C) (Oral)   Resp 18   Ht 5\' 11"  (1.803 m)   Wt 195 lb (88.5 kg)   SpO2 95%   BMI 27.20 kg/m      Physical Exam Vitals and nursing note reviewed.  Constitutional:      General: He is not in acute distress.    Appearance: Normal appearance. He is well-developed and well-nourished. He is not ill-appearing.  HENT:     Head: Normocephalic and atraumatic.      Comments: Area of pain, no tenderness, no warmth, no erythema    Right Ear: Tympanic membrane normal.     Left Ear: Tympanic membrane normal.     Nose: Nose normal.     Mouth/Throat:     Mouth: Mucous membranes are moist.     Pharynx: Oropharynx is clear.  Eyes:     Extraocular Movements: Extraocular movements intact.     Conjunctiva/sclera: Conjunctivae normal.     Pupils: Pupils are equal, round, and reactive to light.  Cardiovascular:     Rate and Rhythm: Normal rate and regular rhythm.     Heart sounds: Normal heart sounds. No murmur heard.   Pulmonary:     Effort: Pulmonary effort is  normal. No respiratory distress.     Breath sounds: Normal breath sounds. No stridor. No wheezing, rhonchi or rales.  Chest:     Chest wall: No tenderness.  Abdominal:     Palpations: Abdomen is soft.     Tenderness: There is no abdominal tenderness.  Musculoskeletal:        General: No edema. Normal range of motion.     Cervical back: Normal range of motion and neck supple.  Lymphadenopathy:     Cervical: Cervical adenopathy present.  Skin:    General: Skin is warm and dry.     Capillary Refill: Capillary refill takes less than 2 seconds.  Neurological:     General: No focal deficit present.     Mental Status: He is alert and oriented to person, place, and time. Mental status is at baseline.     Cranial Nerves: No cranial nerve deficit.     Sensory: No sensory deficit.     Motor: No weakness.     Coordination: Coordination normal.     Gait: Gait normal.     Deep Tendon Reflexes: Reflexes normal.  Psychiatric:        Mood and Affect: Mood and affect and mood normal.        Behavior: Behavior normal.        Thought Content: Thought content normal.      UC Treatments / Results  Labs (all labs ordered are listed, but only abnormal results are displayed) Labs Reviewed - No data to display  EKG   Radiology No results found.  Procedures Procedures (including critical care time)  Medications Ordered in UC Medications  ketorolac (TORADOL) 30 MG/ML injection 30 mg (has no administration in time range)  dexamethasone (DECADRON) injection 10 mg (has no administration in time range)    Initial Impression / Assessment and Plan / UC Course  I have reviewed the triage vital signs and the nursing notes.  Pertinent labs & imaging results that were available during my care of the patient were reviewed by me and considered in my medical decision making (see chart for details).     Headache Fever  Toradol 30 mg IM in office today Decadron 10 mg IM in office today Suspect  infectious versus inflammatory process to the area since having Covid This is a recurrent issue Discussed that he can follow-up with the ER if symptoms worsen Otherwise follow-up with primary care for possible CT for further evaluation and work-up Final Clinical Impressions(s) / UC Diagnoses   Final diagnoses:  Fever, unspecified fever cause  Nonintractable headache, unspecified chronicity pattern, unspecified headache type     Discharge Instructions     You have received a Toradol injection for pain in the office today.  You have also received a steroid injection in the office today.  I would recommend following up with Dr. Hilma Favors because you may need a CT of your head to evaluate why this pain keeps coming back  Follow up with this office or with primary care if symptoms are persisting.  Follow up in the ER for high fever, trouble swallowing, trouble breathing, other concerning symptoms.     ED Prescriptions    None     PDMP not reviewed this encounter.   Faustino Congress, NP 09/25/20 1246

## 2020-09-25 NOTE — ED Triage Notes (Signed)
Low grade fever, back pain and headache x 2 months.  Had covid in November

## 2020-10-27 DIAGNOSIS — H18413 Arcus senilis, bilateral: Secondary | ICD-10-CM | POA: Diagnosis not present

## 2020-10-27 DIAGNOSIS — H25013 Cortical age-related cataract, bilateral: Secondary | ICD-10-CM | POA: Diagnosis not present

## 2020-10-27 DIAGNOSIS — H25043 Posterior subcapsular polar age-related cataract, bilateral: Secondary | ICD-10-CM | POA: Diagnosis not present

## 2020-10-27 DIAGNOSIS — H2513 Age-related nuclear cataract, bilateral: Secondary | ICD-10-CM | POA: Diagnosis not present

## 2020-11-09 DIAGNOSIS — H2512 Age-related nuclear cataract, left eye: Secondary | ICD-10-CM | POA: Diagnosis not present

## 2020-11-10 DIAGNOSIS — H2511 Age-related nuclear cataract, right eye: Secondary | ICD-10-CM | POA: Diagnosis not present

## 2020-11-23 DIAGNOSIS — H2511 Age-related nuclear cataract, right eye: Secondary | ICD-10-CM | POA: Diagnosis not present

## 2020-12-03 ENCOUNTER — Encounter: Payer: Self-pay | Admitting: Emergency Medicine

## 2020-12-03 ENCOUNTER — Ambulatory Visit
Admission: EM | Admit: 2020-12-03 | Discharge: 2020-12-03 | Disposition: A | Discharge status: Home / Self Care | Payer: Medicare Other | Attending: Emergency Medicine | Admitting: Emergency Medicine

## 2020-12-03 ENCOUNTER — Other Ambulatory Visit: Payer: Self-pay

## 2020-12-03 DIAGNOSIS — J069 Acute upper respiratory infection, unspecified: Secondary | ICD-10-CM

## 2020-12-03 MED ORDER — BENZONATATE 100 MG PO CAPS: 100.0000 mg | ORAL_CAPSULE | Freq: Three times a day (TID) | ORAL | 0 refills | Status: DC

## 2020-12-03 MED ORDER — DEXAMETHASONE SODIUM PHOSPHATE 10 MG/ML IJ SOLN
10.0000 mg | Freq: Once | INTRAMUSCULAR | Status: AC
  Administered 2020-12-03: 10 mg via INTRAMUSCULAR

## 2020-12-03 NOTE — Discharge Instructions (Signed)
Declines covid test Get plenty of rest and push fluids Steroid shot given in office Tessalon perles for cough Use OTC zyrtec for nasal congestion, runny nose, and/or sore throat Use OTC flonase for nasal congestion and runny nose Use medications daily for symptom relief Use OTC medications like ibuprofen or tylenol as needed fever or pain Call or go to the ED if you have any new or worsening symptoms such as fever, worsening cough, shortness of breath, chest tightness, chest pain, turning blue, changes in mental status, etc..Marland Kitchen

## 2020-12-03 NOTE — ED Provider Notes (Signed)
[place]   [id number] 12/03/20 Arrival Time: 2751   CC: COVID symptoms  SUBJECTIVE: History from: patient.  Daniel [name] is a 65 y.o. male who presents with nasal congestion, PND, and coughing with yellow sputum x 2 days.  Denies sick exposure to COVID, flu or strep.  Denies alleviating factors.  Symptoms are made worse at night.  Reports previous symptoms in the past.   Denies fever, chills, SOB, wheezing, chest pain, nausea, changes in bowel or bladder habits.    ROS: As per HPI.  All other pertinent ROS negative.     Past Medical History:  Diagnosis Date  . Anxiety   . Bilateral renal artery stenosis ([place])   . History of tobacco abuse   . Hyperlipidemia   . Hypertension   . Hypothyroidism   . Palpitations   . Thyroid disease    Past Surgical History:  Procedure Laterality Date  . COLONOSCOPY N/A 07/11/2016   Procedure: COLONOSCOPY;  Surgeon: Rogene Houston, MD;  [facility];  Service: Endoscopy;  Laterality: N/A;  930  . EYE SURGERY    . MENISCUS REPAIR Left 05/29/13  . POLYPECTOMY  07/11/2016   Procedure: POLYPECTOMY;  Surgeon: Rogene Houston, MD;  [facility];  Service: Endoscopy;;  colon  . US ECHOCARDIOGRAPHY  07/18/2012   No Known Allergies No current facility-administered medications on file prior to encounter.   Current Outpatient Medications on File Prior to Encounter  Medication Sig Dispense Refill  . escitalopram (LEXAPRO) 10 MG tablet Take 10 mg by mouth at bedtime.     Marland Kitchen ibuprofen (ADVIL,MOTRIN) 200 MG tablet Take 200-400 mg by mouth every 6 (six) hours as needed (For pain/arthritis.).    Marland Kitchen levothyroxine (SYNTHROID, LEVOTHROID) 125 MCG tablet Take 62.5-125 mcg by mouth as directed. 62.5 MCG (ONE-HALF TABLET) ON Thursday AND Friday, 125 MCG (ONE TABLET) ON ALL OTHER DAYS.    Marland Kitchen nebivolol (BYSTOLIC) 5 MG tablet Take 1 tablet (5 mg total) by mouth every morning. 30 tablet 6  . rosuvastatin (CRESTOR) 5 MG tablet Take 2.5  mg 3 times a week 90 tablet 1  . Testosterone 30 MG MISC Place inside cheek.    . valsartan-hydrochlorothiazide (DIOVAN HCT) 80-12.5 MG tablet Take 0.5 tablets by mouth daily. 45 tablet 3  . [DISCONTINUED] cetirizine (ZYRTEC ALLERGY) 10 MG tablet Take 1 tablet (10 mg total) by mouth daily. 30 tablet 0  . [DISCONTINUED] fluticasone (FLONASE) 50 MCG/ACT nasal spray Place 1 spray into both nostrils daily for 14 days. 16 g 0   Social History   Socioeconomic History  . Marital status: Married    Spouse name: Not on file  . Number of children: Not on file  . Years of education: Not on file  . Highest education level: Not on file  Occupational History  . Not on file  Tobacco Use  . Smoking status: Former Smoker    Packs/day: 2.00    Years: 40.00    Pack years: 80.00    Types: Cigarettes    Quit date: 12/16/2010    Years since quitting: 9.9  . Smokeless tobacco: Current User    Types: Snuff  Substance and Sexual Activity  . Alcohol use: Yes    Alcohol/week: 28.0 standard drinks    Types: 28 Standard drinks or equivalent per week    Comment: daily  . Drug use: No  . Sexual activity: Not on file  Other Topics Concern  . Not on file  Social History Narrative  . Not on file   Social Determinants of Health   Financial Resource Strain: Not on file  Food Insecurity: Not on file  Transportation Needs: Not on file  Physical Activity: Not on file  Stress: Not on file  Social Connections: Not on file  Intimate Partner Violence: Not on file   Family History  Problem Relation Age of Onset  . Hyperlipidemia Father   . Hypertension Father   . Stroke Father   . CAD Maternal Grandmother   . Heart attack Maternal Grandfather   . Cancer Mother   . Diabetes Mother   . Hypertension Mother     OBJECTIVE:  Vitals:   12/03/20 1142  BP: (!) 146/74  Pulse: 79  Resp: 17  Temp: 97.7 F (36.5 C)  TempSrc: Tympanic  SpO2: 97%     General appearance: alert; well-appearing, nontoxic;  speaking in full sentences and tolerating own secretions HEENT: NCAT; Ears: EACs clear, TMs pearly gray; Eyes: PERRL.  EOM grossly intact.Nose: nares patent trace clear rhinorrhea, Throat: oropharynx clear, mildly erythematous, tonsils non erythematous or enlarged, uvula midline  Neck: supple without LAD Lungs: unlabored respirations, symmetrical air entry; cough: absent; no respiratory distress; CTAB Heart: regular rate and rhythm.  Skin: warm and dry Psychological: alert and cooperative; normal mood and affect   ASSESSMENT & PLAN:  1. Viral URI with cough     Meds ordered this encounter  Medications  . benzonatate (TESSALON) 100 MG capsule    Sig: Take 1 capsule (100 mg total) by mouth every 8 (eight) hours.    Dispense:  21 capsule    Refill:  0    Order Specific Question:   Supervising Provider    Answer:   Raylene Everts [[id number]]  . dexamethasone (DECADRON) injection 10 mg    Declines covid test Get plenty of rest and push fluids Steroid shot given in office Tessalon perles for cough Use OTC zyrtec for nasal congestion, runny nose, and/or sore throat Use OTC flonase for nasal congestion and runny nose Use medications daily for symptom relief Use OTC medications like ibuprofen or tylenol as needed fever or pain Call or go to the ED if you have any new or worsening symptoms such as fever, worsening cough, shortness of breath, chest tightness, chest pain, turning blue, changes in mental status, etc...   Reviewed expectations re: course of current medical issues. Questions answered. Outlined signs and symptoms indicating need for more acute intervention. Patient verbalized understanding. After Visit Summary given.         Lestine Box, PA-C 12/03/20 1229

## 2020-12-03 NOTE — ED Triage Notes (Signed)
Nasal congestion, coughing - yellow sputum.  Symptoms x 2 days.

## 2020-12-21 DIAGNOSIS — H35363 Drusen (degenerative) of macula, bilateral: Secondary | ICD-10-CM | POA: Diagnosis not present

## 2021-01-12 DIAGNOSIS — Z0001 Encounter for general adult medical examination with abnormal findings: Secondary | ICD-10-CM | POA: Diagnosis not present

## 2021-01-12 DIAGNOSIS — Z6829 Body mass index (BMI) 29.0-29.9, adult: Secondary | ICD-10-CM | POA: Diagnosis not present

## 2021-01-12 DIAGNOSIS — I10 Essential (primary) hypertension: Secondary | ICD-10-CM | POA: Diagnosis not present

## 2021-01-12 DIAGNOSIS — E039 Hypothyroidism, unspecified: Secondary | ICD-10-CM | POA: Diagnosis not present

## 2021-01-12 DIAGNOSIS — Z1389 Encounter for screening for other disorder: Secondary | ICD-10-CM | POA: Diagnosis not present

## 2021-01-12 DIAGNOSIS — I701 Atherosclerosis of renal artery: Secondary | ICD-10-CM | POA: Diagnosis not present

## 2021-01-12 DIAGNOSIS — R002 Palpitations: Secondary | ICD-10-CM | POA: Diagnosis not present

## 2021-02-09 DIAGNOSIS — M25561 Pain in right knee: Secondary | ICD-10-CM | POA: Diagnosis not present

## 2021-02-09 DIAGNOSIS — M170 Bilateral primary osteoarthritis of knee: Secondary | ICD-10-CM | POA: Diagnosis not present

## 2021-02-09 DIAGNOSIS — M25562 Pain in left knee: Secondary | ICD-10-CM | POA: Diagnosis not present

## 2021-02-09 DIAGNOSIS — I701 Atherosclerosis of renal artery: Secondary | ICD-10-CM | POA: Diagnosis not present

## 2021-02-16 ENCOUNTER — Telehealth: Payer: Self-pay | Admitting: Cardiovascular Disease

## 2021-02-16 NOTE — Telephone Encounter (Signed)
Patient c/o Palpitations:  High priority if patient c/o lightheadedness, shortness of breath, or chest pain  1) How long have you had palpitations/irregular HR/ Afib? Are you having the symptoms now? Around a week ago; yes   2) Are you currently experiencing lightheadedness, SOB or CP?  No   3) Do you have a history of afib (atrial fibrillation) or irregular heart rhythm? No   4) Have you checked your BP or HR? (document readings if available): no   5) Are you experiencing any other symptoms? No

## 2021-02-16 NOTE — Telephone Encounter (Signed)
Spoke with pt, he has seen dr berry in the past for palpitations, they went away and now have come back. He does not get them daily but he can have them 3-4 days in a row. He reports that they happened this morning after drinking caffeine coffee. He reports it feels like a skip and fluttering in his chest. His usual heart rate is 50-60 bpm. Explained to patient he needs to drink decaf coffee and to avoid other caffeine beverages. Follow up scheduled

## 2021-02-21 DIAGNOSIS — E039 Hypothyroidism, unspecified: Secondary | ICD-10-CM | POA: Diagnosis not present

## 2021-02-22 ENCOUNTER — Other Ambulatory Visit (HOSPITAL_COMMUNITY): Payer: Self-pay | Admitting: Family Medicine

## 2021-02-22 DIAGNOSIS — R918 Other nonspecific abnormal finding of lung field: Secondary | ICD-10-CM

## 2021-03-06 ENCOUNTER — Encounter: Payer: Self-pay | Admitting: Emergency Medicine

## 2021-03-06 ENCOUNTER — Ambulatory Visit
Admission: EM | Admit: 2021-03-06 | Discharge: 2021-03-06 | Disposition: A | Discharge status: Home / Self Care | Payer: Medicare Other | Attending: Family Medicine | Admitting: Family Medicine

## 2021-03-06 ENCOUNTER — Other Ambulatory Visit: Payer: Self-pay

## 2021-03-06 DIAGNOSIS — U071 COVID-19: Secondary | ICD-10-CM

## 2021-03-06 DIAGNOSIS — R062 Wheezing: Secondary | ICD-10-CM

## 2021-03-06 MED ORDER — PREDNISONE 20 MG PO TABS: 40.0000 mg | ORAL_TABLET | Freq: Every day | ORAL | 0 refills | Status: DC

## 2021-03-06 NOTE — ED Provider Notes (Signed)
[facility]   [id number] 03/06/21 Arrival Time: 5809  ASSESSMENT & PLAN:  1. COVID-19 virus infection   2. Wheezing    Discussed typical duration of COVID. OTC symptom care as needed.  Begin: Meds ordered this encounter  Medications   predniSONE (DELTASONE) 20 MG tablet    Sig: Take 2 tablets (40 mg total) by mouth daily.    Dispense:  10 tablet    Refill:  0     Follow-up Information     Sharilyn Sites, MD.   Specialty: Family Medicine Why: As needed. Contact information: [address] [place] [address] [phone]                 Reviewed expectations re: course of current medical issues. Questions answered. Outlined signs and symptoms indicating need for more acute intervention. Understanding verbalized. After Visit Summary given.   SUBJECTIVE: History from: patient. Daniel [name] is a 65 y.o. male who presents with worries regarding COVID-19. Known COVID-19 contact: wife. Recent travel: none. Reports: cough, HA, nasal congestion; x 3 d. Denies: fever and difficulty breathing. Normal PO intake without n/v/d.   OBJECTIVE:  Vitals:   03/06/21 1052  BP: 132/73  Pulse: 84  Resp: 18  Temp: 97.9 F (36.6 C)  TempSrc: Oral  SpO2: 97%    General appearance: alert; no distress Eyes: PERRLA; EOMI; conjunctiva normal HENT: [place]; AT; with nasal congestion Neck: supple  Lungs: speaks full sentences without difficulty; unlabored; mild wheezing bilaterally Extremities: no edema Skin: warm and dry Neurologic: normal gait Psychological: alert and cooperative; normal mood and affect  No Known Allergies  Past Medical History:  Diagnosis Date   Anxiety    Bilateral renal artery stenosis ([facility])    History of tobacco abuse    Hyperlipidemia    Hypertension    Hypothyroidism    Palpitations    Thyroid disease    Social History   Socioeconomic History   Marital status: Married    Spouse name: Not on file   Number of children:  Not on file   Years of education: Not on file   Highest education level: Not on file  Occupational History   Not on file  Tobacco Use   Smoking status: Former    Packs/day: 2.00    Years: 40.00    Pack years: 80.00    Types: Cigarettes    Quit date: 12/16/2010    Years since quitting: 10.2   Smokeless tobacco: Current    Types: Snuff  Substance and Sexual Activity   Alcohol use: Yes    Alcohol/week: 28.0 standard drinks    Types: 28 Standard drinks or equivalent per week    Comment: daily   Drug use: No   Sexual activity: Not on file  Other Topics Concern   Not on file  Social History Narrative   Not on file   Social Determinants of Health   Financial Resource Strain: Not on file  Food Insecurity: Not on file  Transportation Needs: Not on file  Physical Activity: Not on file  Stress: Not on file  Social Connections: Not on file  Intimate Partner Violence: Not on file   Family History  Problem Relation Age of Onset   Hyperlipidemia Father    Hypertension Father    Stroke Father    CAD Maternal Grandmother    Heart attack Maternal Grandfather    Cancer Mother    Diabetes Mother    Hypertension Mother    Past  Surgical History:  Procedure Laterality Date   COLONOSCOPY N/A 07/11/2016   Procedure: COLONOSCOPY;  Surgeon: Rogene Houston, MD;  [facility];  Service: Endoscopy;  Laterality: N/A;  930   EYE SURGERY     MENISCUS REPAIR Left 05/29/13   POLYPECTOMY  07/11/2016   Procedure: POLYPECTOMY;  Surgeon: Rogene Houston, MD;  [facility];  Service: Endoscopy;;  colon   US ECHOCARDIOGRAPHY  07/18/2012     Vanessa Kick, MD 03/06/21 1116

## 2021-03-06 NOTE — ED Triage Notes (Signed)
Headache, sinus congestion x 3 days .  Had + at home covid test.  Wife currently has covid.

## 2021-03-08 ENCOUNTER — Ambulatory Visit: Payer: Medicare Other | Admitting: Cardiovascular Disease

## 2021-03-23 ENCOUNTER — Ambulatory Visit (HOSPITAL_COMMUNITY)
Admission: RE | Admit: 2021-03-23 | Discharge: 2021-03-23 | Disposition: A | Discharge status: Home / Self Care | Payer: Medicare Other | Source: Ambulatory Visit | Attending: Family Medicine | Admitting: Family Medicine

## 2021-03-23 DIAGNOSIS — Z122 Encounter for screening for malignant neoplasm of respiratory organs: Secondary | ICD-10-CM | POA: Diagnosis not present

## 2021-03-23 DIAGNOSIS — R918 Other nonspecific abnormal finding of lung field: Secondary | ICD-10-CM | POA: Diagnosis not present

## 2021-03-23 DIAGNOSIS — Z87891 Personal history of nicotine dependence: Secondary | ICD-10-CM | POA: Diagnosis not present

## 2021-05-12 ENCOUNTER — Ambulatory Visit: Payer: Medicare Other | Admitting: Cardiovascular Disease

## 2021-05-12 ENCOUNTER — Encounter: Payer: Self-pay | Admitting: Cardiovascular Disease

## 2021-05-12 ENCOUNTER — Ambulatory Visit (INDEPENDENT_AMBULATORY_CARE_PROVIDER_SITE_OTHER): Payer: Medicare Other

## 2021-05-12 ENCOUNTER — Other Ambulatory Visit: Payer: Self-pay

## 2021-05-12 DIAGNOSIS — E782 Mixed hyperlipidemia: Secondary | ICD-10-CM | POA: Diagnosis not present

## 2021-05-12 DIAGNOSIS — I701 Atherosclerosis of renal artery: Secondary | ICD-10-CM

## 2021-05-12 DIAGNOSIS — I10 Essential (primary) hypertension: Secondary | ICD-10-CM | POA: Diagnosis not present

## 2021-05-12 DIAGNOSIS — R002 Palpitations: Secondary | ICD-10-CM | POA: Diagnosis not present

## 2021-05-12 DIAGNOSIS — R0789 Other chest pain: Secondary | ICD-10-CM

## 2021-05-12 MED ORDER — ROSUVASTATIN CALCIUM 10 MG PO TABS: ORAL_TABLET | ORAL | 1 refills | Status: DC

## 2021-05-12 NOTE — Assessment & Plan Note (Signed)
History of bilateral renal artery stenosis with renal Dopplers performed 08/22/2020 showing renal aortic ratios of 2.3 on the right and 2.5 on the left with normal renal dimensions.

## 2021-05-12 NOTE — Progress Notes (Signed)
05/12/2021 SALMAAN [name]   [birth year]  [id number]  Primary Physician Sharilyn Sites, MD Primary Cardiologist: Lorretta Harp MD Daniel [name], [place]  HPI:  Daniel [name] is a 65 y.o. 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 I last saw   11/18/2019.He was previously a patient of Dr. Terance Ice. I apparently took care of his father prior to his death. His cardiac risk factor profile is positive for 80 pack years of tobacco abuse having quit 7  years ago, treated hypertension and mild hyperlipidemia. He has never had a heart for stroke. He denies chest pain or shortness of breath. He had a negative Myoview stress test 07/18/12 a normal 2-D echo at that time. His major complaint is of palpitations. This has been evaluated in the past with an event monitor, 2-D echo and Myoview. 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 He does admit to excessive caffeine or alcohol intake. Also has hypothyroidism on thyroid replacement therapy. Apparently has values were elevated and his primary care physician is titrating his thyroid replacement. He does complain of bilateral leg; claudication with lower extremity Dopplers Were performed 01/03/16 which were entirely normal.    He was complaining of palpitations prior to my my office visit several years ago and saw Almyra Deforest PA-C who titrated his beta-blocker resulting in improvement in his symptoms.     He had a coronary CTA performed 10/22 5/20 revealing no hemodynamically significant lesions with all FFR greater than 0.8.  He has had more palpitations recently.  His primary care physician cut his Bystolic in half because of bradycardia.  He does think that anxiety has a big effect on his palpitations however.  Since I saw him a year and a half ago he unfortunately lost his mother who is 16 years old 3 weeks ago.  He did have  increased palpitations over several days subsequent to that.  During the palpitations he does complain of some atypical chest pain.  He does admit to drinking caffeine and beer.  He complains of dyspnea as well.   Current Meds  Medication Sig   escitalopram (LEXAPRO) 10 MG tablet Take 10 mg by mouth at bedtime.    ibuprofen (ADVIL,MOTRIN) 200 MG tablet Take 200-400 mg by mouth every 6 (six) hours as needed (For pain/arthritis.).   levothyroxine (SYNTHROID, LEVOTHROID) 125 MCG tablet Take 62.5-125 mcg by mouth as directed. 62.5 MCG (ONE-HALF TABLET) ON Thursday AND Friday, 125 MCG (ONE TABLET) ON ALL OTHER DAYS.   nebivolol (BYSTOLIC) 5 MG tablet Take 1 tablet (5 mg total) by mouth every morning.   predniSONE (DELTASONE) 20 MG tablet Take 2 tablets (40 mg total) by mouth daily.   rosuvastatin (CRESTOR) 5 MG tablet Take 2.5 mg 3 times a week   Testosterone 30 MG MISC Place inside cheek.     No Known Allergies  Social History   Socioeconomic History   Marital status: Married    Spouse name: Not on file   Number of children: Not on file   Years of education: Not on file   Highest education level: Not on file  Occupational History   Not on file  Tobacco Use   Smoking status: Former    Packs/day: 2.00    Years: 40.00    Pack years: 80.00    Types: Cigarettes    Quit date: 12/16/2010  Years since quitting: 10.4   Smokeless tobacco: Current    Types: Snuff  Substance and Sexual Activity   Alcohol use: Yes    Alcohol/week: 28.0 standard drinks    Types: 28 Standard drinks or equivalent per week    Comment: daily   Drug use: No   Sexual activity: Not on file  Other Topics Concern   Not on file  Social History Narrative   Not on file   Social Determinants of Health   Financial Resource Strain: Not on file  Food Insecurity: Not on file  Transportation Needs: Not on file  Physical Activity: Not on file  Stress: Not on file  Social Connections: Not on file  Intimate Partner  Violence: Not on file     Review of Systems: General: negative for chills, fever, night sweats or weight changes.  Cardiovascular: negative for chest pain, dyspnea on exertion, edema, orthopnea, palpitations, paroxysmal nocturnal dyspnea or shortness of breath Dermatological: negative for rash Respiratory: negative for cough or wheezing Urologic: negative for hematuria Abdominal: negative for nausea, vomiting, diarrhea, bright red blood per rectum, melena, or hematemesis Neurologic: negative for visual changes, syncope, or dizziness All other systems reviewed and are otherwise negative except as noted above.    Blood pressure 122/76, pulse 62, height '5\' 11"'$  (1.803 m), weight 210 lb 3.2 oz (95.3 kg), SpO2 95 %.  General appearance: alert and no distress Neck: no adenopathy, no carotid bruit, no JVD, supple, symmetrical, trachea midline, and thyroid not enlarged, symmetric, no tenderness/mass/nodules Lungs: clear to auscultation bilaterally Heart: regular rate and rhythm, S1, S2 normal, no murmur, click, rub or gallop Extremities: extremities normal, atraumatic, no cyanosis or edema Pulses: 2+ and symmetric Skin: Skin color, texture, turgor normal. No rashes or lesions Neurologic: Grossly normal  EKG sinus rhythm at 62 with nonspecific ST and T wave changes.  I personally reviewed this EKG.  ASSESSMENT AND PLAN:   Hypertension History of essential hypertension a blood pressure measured today at 122/76.  He is on Bystolic, valsartan and hydrochlorothiazide.  Hyperlipidemia History of hyperlipidemia on low-dose atorvastatin with lipid profile performed 01/04/2021 revealing total cholesterol 173, LDL of 92 and HDL 45.  Given his coronary calcium score of 1129, I am going to increase his rosuvastatin to 10 mg daily and we will recheck a lipid liver profile in 3 months.  Goal is LDL less than 70.  Palpitations History of palpitations shown to be PVCs in the past.  They have become more  frequent recently potentially because of the loss of his mother.  I am going get a 2-week Zio patch to further evaluate.  He is on Bystolic as well we talked about the importance of moderating his caffeine and alcohol intake.  Bilateral renal artery stenosis ([facility]) History of bilateral renal artery stenosis with renal Dopplers performed 08/22/2020 showing renal aortic ratios of 2.3 on the right and 2.5 on the left with normal renal dimensions.  Atypical chest pain History of atypical chest pain with a coronary calcium score of 1129 performed 07/08/2019 with an unremarkable coronary CTA showing no obstructive disease.     Lorretta Harp MD FACP,FACC,FAHA, [facility] 05/12/2021 11:37 AM

## 2021-05-12 NOTE — Patient Instructions (Signed)
Medication Instructions:  Increase Crestor to 10 mg daily   *If you need a refill on your cardiac medications before your next appointment, please call your pharmacy*   Lab Work: LIPID/LIVER in 3 months (come back fasting, nothing to eat or drink, no lab appointment needed)  If you have labs (blood work) drawn today and your tests are completely normal, you will receive your results only by: [place] (if you have MyChart) OR A paper copy in the mail If you have any lab test that is abnormal or we need to change your treatment, we will call you to review the results.   Testing/Procedures: Echocardiogram - Your physician has requested that you have an echocardiogram. Echocardiography is a painless test that uses sound waves to create images of your heart. It provides your doctor with information about the size and shape of your heart and how well your heart's chambers and valves are working. This procedure takes approximately one hour. There are no restrictions for this procedure. This will be performed at our [facility] location - [address].  ZIO XT- Long Term Monitor Instructions  Your physician has requested you wear a ZIO patch monitor for 14 days.  This is a single patch monitor. [employer] supplies one patch monitor per enrollment. Additional stickers are not available. Please do not apply patch if you will be having a Nuclear Stress Test,  Echocardiogram, Cardiac CT, MRI, or Chest Xray during the period you would be wearing the  monitor. The patch cannot be worn during these tests. You cannot remove and re-apply the  ZIO XT patch monitor.  Your ZIO patch monitor will be mailed 3 day USPS to your address on file. It may take 3-5 days  to receive your monitor after you have been enrolled.  Once you have received your monitor, please review the enclosed instructions. Your monitor  has already been registered assigning a specific monitor serial # to you.  Billing  and Patient Assistance Program Information  We have supplied [employer] with any of your insurance information on file for billing purposes. [employer] offers a sliding scale Patient Assistance Program for patients that do not have  insurance, or whose insurance does not completely cover the cost of the ZIO monitor.  You must apply for the Patient Assistance Program to qualify for this discounted rate.  To apply, please call [employer] at [phone], select option 4, select option 2, ask to apply for  Patient Assistance Program. Theodore Demark will ask your household income, and how many people  are in your household. They will quote your out-of-pocket cost based on that information.  [employer] will also be able to set up a 60-month interest-free payment plan if needed.  Applying the monitor   Shave hair from upper left chest.  Hold abrader disc by orange tab. Rub abrader in 40 strokes over the upper left chest as  indicated in your monitor instructions.  Clean area with 4 enclosed alcohol pads. Let dry.  Apply patch as indicated in monitor instructions. Patch will be placed under collarbone on left  side of chest with arrow pointing upward.  Rub patch adhesive wings for 2 minutes. Remove white label marked "1". Remove the white  label marked "2". Rub patch adhesive wings for 2 additional minutes.  While looking in a mirror, press and release button in center of patch. A small green light will  flash 3-4 times. This will be your only indicator that the monitor has  been turned on.  Do not shower for the first 24 hours. You may shower after the first 24 hours.  Press the button if you feel a symptom. You will hear a small click. Record Date, Time and  Symptom in the Patient Logbook.  When you are ready to remove the patch, follow instructions on the last 2 pages of Patient  Logbook. Stick patch monitor onto the last page of Patient Logbook.  Place Patient Logbook in the blue and white box. Use locking tab  on box and tape box closed  securely. The blue and white box has prepaid postage on it. Please place it in the mailbox as  soon as possible. Your physician should have your test results approximately 7 days after the  monitor has been mailed back to [facility].  Call [facility] at [phone] if you have questions regarding  your ZIO XT patch monitor. Call them immediately if you see an orange light blinking on your  monitor.  If your monitor falls off in less than 4 days, contact our Monitor department at [phone].  If your monitor becomes loose or falls off after 4 days call [employer] at [phone] for  suggestions on securing your monitor   Follow-Up: At [facility], you and your health needs are our priority.  As part of our continuing mission to provide you with exceptional heart care, we have created designated Provider Care Teams.  These Care Teams include your primary Cardiologist (physician) and Advanced Practice Providers (APPs -  Physician Assistants and Nurse Practitioners) who all work together to provide you with the care you need, when you need it.  We recommend signing up for the patient portal called "MyChart".  Sign up information is provided on this After Visit Summary.  MyChart is used to connect with patients for Virtual Visits (Telemedicine).  Patients are able to view lab/test results, encounter notes, upcoming appointments, etc.  Non-urgent messages can be sent to your provider as well.   To learn more about what you can do with MyChart, go to [email or web address].    Your next appointment:   6 month(s)  The format for your next appointment:   In Person  Provider:   Quay Burow, MD

## 2021-05-12 NOTE — Assessment & Plan Note (Signed)
History of palpitations shown to be PVCs in the past.  They have become more frequent recently potentially because of the loss of his mother.  I am going get a 2-week Zio patch to further evaluate.  He is on Bystolic as well we talked about the importance of moderating his caffeine and alcohol intake.

## 2021-05-12 NOTE — Assessment & Plan Note (Signed)
History of hyperlipidemia on low-dose atorvastatin with lipid profile performed 01/04/2021 revealing total cholesterol 173, LDL of 92 and HDL 45.  Given his coronary calcium score of 1129, I am going to increase his rosuvastatin to 10 mg daily and we will recheck a lipid liver profile in 3 months.  Goal is LDL less than 70.

## 2021-05-12 NOTE — Progress Notes (Unsigned)
Enrolled patient for a 14 day Zio XT  monitor to be mailed to patients home  °

## 2021-05-12 NOTE — Assessment & Plan Note (Signed)
History of atypical chest pain with a coronary calcium score of 1129 performed 07/08/2019 with an unremarkable coronary CTA showing no obstructive disease.

## 2021-05-12 NOTE — Assessment & Plan Note (Signed)
History of essential hypertension a blood pressure measured today at 122/76.  He is on Bystolic, valsartan and hydrochlorothiazide.

## 2021-05-15 DIAGNOSIS — R002 Palpitations: Secondary | ICD-10-CM

## 2021-05-23 DIAGNOSIS — H35363 Drusen (degenerative) of macula, bilateral: Secondary | ICD-10-CM | POA: Diagnosis not present

## 2021-06-02 DIAGNOSIS — R002 Palpitations: Secondary | ICD-10-CM | POA: Diagnosis not present

## 2021-06-05 ENCOUNTER — Ambulatory Visit (HOSPITAL_COMMUNITY): Payer: Medicare Other | Attending: Cardiology

## 2021-06-05 ENCOUNTER — Other Ambulatory Visit: Payer: Self-pay

## 2021-06-05 DIAGNOSIS — E039 Hypothyroidism, unspecified: Secondary | ICD-10-CM | POA: Insufficient documentation

## 2021-06-05 DIAGNOSIS — Z87891 Personal history of nicotine dependence: Secondary | ICD-10-CM | POA: Diagnosis not present

## 2021-06-05 DIAGNOSIS — R002 Palpitations: Secondary | ICD-10-CM | POA: Diagnosis not present

## 2021-06-05 DIAGNOSIS — I77819 Aortic ectasia, unspecified site: Secondary | ICD-10-CM | POA: Diagnosis not present

## 2021-06-05 DIAGNOSIS — E785 Hyperlipidemia, unspecified: Secondary | ICD-10-CM | POA: Diagnosis not present

## 2021-06-05 DIAGNOSIS — I10 Essential (primary) hypertension: Secondary | ICD-10-CM | POA: Diagnosis not present

## 2021-06-05 DIAGNOSIS — R0789 Other chest pain: Secondary | ICD-10-CM | POA: Diagnosis not present

## 2021-06-05 LAB — ECHOCARDIOGRAM COMPLETE
Area-P 1/2: 3.77 cm2
S' Lateral: 3 cm

## 2021-08-09 DIAGNOSIS — R197 Diarrhea, unspecified: Secondary | ICD-10-CM | POA: Diagnosis not present

## 2021-08-09 DIAGNOSIS — Z6828 Body mass index (BMI) 28.0-28.9, adult: Secondary | ICD-10-CM | POA: Diagnosis not present

## 2021-08-09 DIAGNOSIS — E663 Overweight: Secondary | ICD-10-CM | POA: Diagnosis not present

## 2021-08-14 DIAGNOSIS — E663 Overweight: Secondary | ICD-10-CM | POA: Diagnosis not present

## 2021-08-14 DIAGNOSIS — I10 Essential (primary) hypertension: Secondary | ICD-10-CM | POA: Diagnosis not present

## 2021-08-14 DIAGNOSIS — E162 Hypoglycemia, unspecified: Secondary | ICD-10-CM | POA: Diagnosis not present

## 2021-08-14 DIAGNOSIS — E291 Testicular hypofunction: Secondary | ICD-10-CM | POA: Diagnosis not present

## 2021-08-15 ENCOUNTER — Encounter (INDEPENDENT_AMBULATORY_CARE_PROVIDER_SITE_OTHER): Payer: Self-pay | Admitting: *Deleted

## 2021-08-16 DIAGNOSIS — E162 Hypoglycemia, unspecified: Secondary | ICD-10-CM | POA: Diagnosis not present

## 2021-08-16 DIAGNOSIS — E291 Testicular hypofunction: Secondary | ICD-10-CM | POA: Diagnosis not present

## 2021-08-16 DIAGNOSIS — E785 Hyperlipidemia, unspecified: Secondary | ICD-10-CM | POA: Diagnosis not present

## 2021-08-21 ENCOUNTER — Other Ambulatory Visit: Payer: Self-pay

## 2021-08-21 ENCOUNTER — Ambulatory Visit (INDEPENDENT_AMBULATORY_CARE_PROVIDER_SITE_OTHER): Payer: Medicare Other | Admitting: Gastroenterology

## 2021-08-21 ENCOUNTER — Encounter (INDEPENDENT_AMBULATORY_CARE_PROVIDER_SITE_OTHER): Payer: Self-pay | Admitting: Gastroenterology

## 2021-08-21 ENCOUNTER — Telehealth (INDEPENDENT_AMBULATORY_CARE_PROVIDER_SITE_OTHER): Payer: Self-pay

## 2021-08-21 ENCOUNTER — Other Ambulatory Visit (INDEPENDENT_AMBULATORY_CARE_PROVIDER_SITE_OTHER): Payer: Self-pay

## 2021-08-21 ENCOUNTER — Encounter (INDEPENDENT_AMBULATORY_CARE_PROVIDER_SITE_OTHER): Payer: Self-pay

## 2021-08-21 VITALS — BP 138/84 | HR 72 | Temp 98.2°F | Ht 71.0 in | Wt 204.0 lb

## 2021-08-21 DIAGNOSIS — R195 Other fecal abnormalities: Secondary | ICD-10-CM | POA: Insufficient documentation

## 2021-08-21 MED ORDER — PEG 3350-KCL-NA BICARB-NACL 420 G PO SOLR: 4000.0000 mL | ORAL | 0 refills | Status: DC

## 2021-08-21 NOTE — Patient Instructions (Addendum)
We will get you scheduled for colonoscopy to further evaluate the changes in your stools. Please let me know if you develop anymore diarrhea, rectal bleeding or black stools. Continue with your current diet as it sounds like you are eating a lot of fruits/veggies which are good for normal bowel habits, please make sure you are staying well hydrated.

## 2021-08-21 NOTE — H&P (View-Only) (Signed)
Referring Provider: Sharilyn Sites, MD Primary Care Physician:  Sharilyn Sites, MD Primary GI Physician: newly established, previously rehman  Chief Complaint  Patient presents with   loose stools    Pt arrives as a new patient to discuss loose stools. Started out where he could not control stool but now stool is more firm but having small amount of stool at a time. Was having some right lower quad pain but that has went away.    HPI:   Daniel [name] is a 65 y.o. male with past medical history of anxiety, hyperlipidemia, hypothyroidism.   Patient presenting today as new patient for diarrhea, referred by Dr. Hilma Favors.  Patient began having diarrhea at the beginning of November. He reports that diarrhea was occurring 2-3 days in a row, then he would have "flat" BMs in between, then would have a few days of diarrhea again. Diarrhea would occur 1-2x/day during those times. He states that he had fecal urgency with occasional fecal incontinence. He has not been on any recent antibiotics, stools were loose at that time with a lot of associated flatulence.  He reports that now e is having one formed stool per day, but he is having to strain a lot to have a BM and only having a small amount of stool on defecation, feels that stools are skinnier in nature.  He has not lost any weight. Has recently changed his diet related to blood sugar issues, he started eating a lot of fruits and vegetables, states that he has no hx of DM, is trying to eat every 2-3 hrs, he is not on any medication for DM. He does drink beer daily. He reports 2-5 beers per day, however, has not drank any beer in the past few weeks. He did have some mid R abdominal pain, however, this has subsided. Denies blood in stools or melena. Denies nausea or vomiting.   He was previously having some acid regurgitation almost daily, he took about a weeks worth of otc PPI. States that symptoms went away. Denies any issues with swallowing. Denies sore  throat, cough or hoarse voice.   NSAID use: occasional nsaids Social MO:QHUTMLY use, 2-5 beers per day, no tobacco, uses snoos (dip pouch) Fam hx:no CRC or liver disease.  Last Colonoscopy:07/11/2016 - Incomplete examation to hepatic flex - The procedure was aborted due to restricted mobility of the colon and significant looping. - One 4 mm polyp at the splenic flexure, removed with a cold snare. Resected and retrieved. - The distal rectum and anal verge are normal on retroflexion view. Virtual colonoscopy: 08/13/21 no acute extracolonic findings Last Endoscopy:n/a  Recommendations:  Colonoscopy 2027 with colowrap  Past Medical History:  Diagnosis Date   Anxiety    Bilateral renal artery stenosis (HCC)    History of tobacco abuse    Hyperlipidemia    Hypertension    Hypothyroidism    Palpitations    Thyroid disease     Past Surgical History:  Procedure Laterality Date   COLONOSCOPY N/A 07/11/2016   Procedure: COLONOSCOPY;  Surgeon: Rogene Houston, MD;  [facility];  Service: Endoscopy;  Laterality: N/A;  930   EYE SURGERY     MENISCUS REPAIR Left 05/29/13   POLYPECTOMY  07/11/2016   Procedure: POLYPECTOMY;  Surgeon: Rogene Houston, MD;  Location: [facility];  Service: Endoscopy;;  colon   US ECHOCARDIOGRAPHY  07/18/2012    Current Outpatient Medications  Medication Sig Dispense Refill   escitalopram (  LEXAPRO) 10 MG tablet Take 10 mg by mouth at bedtime.      levothyroxine (SYNTHROID, LEVOTHROID) 125 MCG tablet Take 62.5-125 mcg by mouth as directed. 62.5 MCG (ONE-HALF TABLET) ON Thursday AND Friday, 125 MCG (ONE TABLET) ON ALL OTHER DAYS.     nebivolol (BYSTOLIC) 5 MG tablet Take 1 tablet (5 mg total) by mouth every morning. 30 tablet 6   rosuvastatin (CRESTOR) 10 MG tablet Take 1 tablet (10 mg) by mouth daily 90 tablet 1   No current facility-administered medications for this visit.    Allergies as of 08/21/2021   (No Known Allergies)    Family  History  Problem Relation Age of Onset   Hyperlipidemia Father    Hypertension Father    Stroke Father    CAD Maternal Grandmother    Heart attack Maternal Grandfather    Cancer Mother    Diabetes Mother    Hypertension Mother     Social History   Socioeconomic History   Marital status: Married    Spouse name: Not on file   Number of children: Not on file   Years of education: Not on file   Highest education level: Not on file  Occupational History   Not on file  Tobacco Use   Smoking status: Former    Packs/day: 2.00    Years: 40.00    Pack years: 80.00    Types: Cigarettes    Quit date: 12/16/2010    Years since quitting: 10.6   Smokeless tobacco: Current    Types: Snuff   Tobacco comments:    snus  Substance and Sexual Activity   Alcohol use: Yes    Alcohol/week: 28.0 standard drinks    Types: 28 Standard drinks or equivalent per week    Comment: beer 2 -5 per day   Drug use: No   Sexual activity: Not on file  Other Topics Concern   Not on file  Social History Narrative   Not on file   Social Determinants of Health   Financial Resource Strain: Not on file  Food Insecurity: Not on file  Transportation Needs: Not on file  Physical Activity: Not on file  Stress: Not on file  Social Connections: Not on file   Review of systems General: negative for malaise, night sweats, fever, chills, weight loss Neck: Negative for lumps, goiter, pain and significant neck swelling Resp: Negative for cough, wheezing, dyspnea at rest CV: Negative for chest pain, leg swelling, palpitations, orthopnea GI: denies melena, hematochezia, nausea, vomiting, dysphagia, odyonophagia, early satiety or unintentional weight loss. +changes in stool  MSK: Negative for joint pain or swelling, back pain, and muscle pain. Derm: Negative for itching or rash Psych: Denies depression, anxiety, memory loss, confusion. No homicidal or suicidal ideation.  Heme: Negative for prolonged bleeding,  bruising easily, and swollen nodes. Endocrine: Negative for cold or heat intolerance, polyuria, polydipsia and goiter. Neuro: negative for tremor, gait imbalance, syncope and seizures. The remainder of the review of systems is noncontributory.  Physical Exam: BP 138/84 (BP Location: Right Arm, Patient Position: Sitting, Cuff Size: Large)    Pulse 72    Temp 98.2 F (36.8 C) (Oral)    Ht 5\' 11"  (1.803 m)    Wt 204 lb (92.5 kg)    BMI 28.45 kg/m  General:   Alert and oriented. No distress noted. Pleasant and cooperative.  Head:  Normocephalic and atraumatic. Eyes:  Conjuctiva clear without scleral icterus. Mouth:  Oral mucosa pink and moist.  Good dentition. No lesions. Heart: Normal rate and rhythm, s1 and s2 heart sounds present.  Lungs: Clear lung sounds in all lobes. Respirations equal and unlabored. Abdomen:  +BS, soft, non-tender and non-distended. No rebound or guarding. No HSM or masses noted. Derm: No palmar erythema or jaundice Msk:  Symmetrical without gross deformities. Normal posture. Extremities:  Without edema. Neurologic:  Alert and  oriented x4 Psych:  Alert and cooperative. Normal mood and affect.  Invalid input(s): 6 MONTHS   ASSESSMENT: Daniel [name] is a 65 y.o. male presenting today as new patient, for diarrhea.  Diarrhea started at the beginning of November, was occurring every 2-3 days, 1-2x/day, loose but not watery, he reports "flat" stools in between episodes of diarrhea. Now he is no longer having diarrhea, he is having one formed BM per day, but feels that he has to strain a lot to go and only has a small amount of stool on defecation, stools are skinnier in nature than normal. He denies any abdominal pain, weight loss, rectal bleeding, melena, nausea, or vomiting. Given the change in nature of his stools, we will proceed with colonoscopy for further evaluation, he will let me know if he develops any new or worsening GI symptoms.    PLAN:  Colonoscopy with  colowrap 2.  Continue with current diet 3. Stay well hydrated 4. Patient to let me know if he develops anymore diarrhea, new GI symptoms   Follow Up: TBD after colonoscopy  Leighann Amadon L. Alver Sorrow, MSN, APRN, AGNP-C Adult-Gerontology Nurse Practitioner [facility]

## 2021-08-21 NOTE — Progress Notes (Signed)
Referring Provider: Sharilyn Sites, MD Primary Care Physician:  Sharilyn Sites, MD Primary GI Physician: newly established, previously rehman  Chief Complaint  Patient presents with   loose stools    Pt arrives as a new patient to discuss loose stools. Started out where he could not control stool but now stool is more firm but having small amount of stool at a time. Was having some right lower quad pain but that has went away.    HPI:   Daniel [name] is a 65 y.o. male with past medical history of anxiety, hyperlipidemia, hypothyroidism.   Patient presenting today as new patient for diarrhea, referred by Dr. Hilma Favors.  Patient began having diarrhea at the beginning of November. He reports that diarrhea was occurring 2-3 days in a row, then he would have "flat" BMs in between, then would have a few days of diarrhea again. Diarrhea would occur 1-2x/day during those times. He states that he had fecal urgency with occasional fecal incontinence. He has not been on any recent antibiotics, stools were loose at that time with a lot of associated flatulence.  He reports that now e is having one formed stool per day, but he is having to strain a lot to have a BM and only having a small amount of stool on defecation, feels that stools are skinnier in nature.  He has not lost any weight. Has recently changed his diet related to blood sugar issues, he started eating a lot of fruits and vegetables, states that he has no hx of DM, is trying to eat every 2-3 hrs, he is not on any medication for DM. He does drink beer daily. He reports 2-5 beers per day, however, has not drank any beer in the past few weeks. He did have some mid R abdominal pain, however, this has subsided. Denies blood in stools or melena. Denies nausea or vomiting.   He was previously having some acid regurgitation almost daily, he took about a weeks worth of otc PPI. States that symptoms went away. Denies any issues with swallowing. Denies sore  throat, cough or hoarse voice.   NSAID use: occasional nsaids Social EP:PIRJJOA use, 2-5 beers per day, no tobacco, uses snoos (dip pouch) Fam hx:no CRC or liver disease.  Last Colonoscopy:07/11/2016 - Incomplete examation to hepatic flex - The procedure was aborted due to restricted mobility of the colon and significant looping. - One 4 mm polyp at the splenic flexure, removed with a cold snare. Resected and retrieved. - The distal rectum and anal verge are normal on retroflexion view. Virtual colonoscopy: 08/13/21 no acute extracolonic findings Last Endoscopy:n/a  Recommendations:  Colonoscopy 2027 with colowrap  Past Medical History:  Diagnosis Date   Anxiety    Bilateral renal artery stenosis (HCC)    History of tobacco abuse    Hyperlipidemia    Hypertension    Hypothyroidism    Palpitations    Thyroid disease     Past Surgical History:  Procedure Laterality Date   COLONOSCOPY N/A 07/11/2016   Procedure: COLONOSCOPY;  Surgeon: Rogene Houston, MD;  [facility];  Service: Endoscopy;  Laterality: N/A;  930   EYE SURGERY     MENISCUS REPAIR Left 05/29/13   POLYPECTOMY  07/11/2016   Procedure: POLYPECTOMY;  Surgeon: Rogene Houston, MD;  [facility];  Service: Endoscopy;;  colon   US ECHOCARDIOGRAPHY  07/18/2012    Current Outpatient Medications  Medication Sig Dispense Refill   escitalopram (  LEXAPRO) 10 MG tablet Take 10 mg by mouth at bedtime.      levothyroxine (SYNTHROID, LEVOTHROID) 125 MCG tablet Take 62.5-125 mcg by mouth as directed. 62.5 MCG (ONE-HALF TABLET) ON Thursday AND Friday, 125 MCG (ONE TABLET) ON ALL OTHER DAYS.     nebivolol (BYSTOLIC) 5 MG tablet Take 1 tablet (5 mg total) by mouth every morning. 30 tablet 6   rosuvastatin (CRESTOR) 10 MG tablet Take 1 tablet (10 mg) by mouth daily 90 tablet 1   No current facility-administered medications for this visit.    Allergies as of 08/21/2021   (No Known Allergies)    Family  History  Problem Relation Age of Onset   Hyperlipidemia Father    Hypertension Father    Stroke Father    CAD Maternal Grandmother    Heart attack Maternal Grandfather    Cancer Mother    Diabetes Mother    Hypertension Mother     Social History   Socioeconomic History   Marital status: Married    Spouse name: Not on file   Number of children: Not on file   Years of education: Not on file   Highest education level: Not on file  Occupational History   Not on file  Tobacco Use   Smoking status: Former    Packs/day: 2.00    Years: 40.00    Pack years: 80.00    Types: Cigarettes    Quit date: 12/16/2010    Years since quitting: 10.6   Smokeless tobacco: Current    Types: Snuff   Tobacco comments:    snus  Substance and Sexual Activity   Alcohol use: Yes    Alcohol/week: 28.0 standard drinks    Types: 28 Standard drinks or equivalent per week    Comment: beer 2 -5 per day   Drug use: No   Sexual activity: Not on file  Other Topics Concern   Not on file  Social History Narrative   Not on file   Social Determinants of Health   Financial Resource Strain: Not on file  Food Insecurity: Not on file  Transportation Needs: Not on file  Physical Activity: Not on file  Stress: Not on file  Social Connections: Not on file   Review of systems General: negative for malaise, night sweats, fever, chills, weight loss Neck: Negative for lumps, goiter, pain and significant neck swelling Resp: Negative for cough, wheezing, dyspnea at rest CV: Negative for chest pain, leg swelling, palpitations, orthopnea GI: denies melena, hematochezia, nausea, vomiting, dysphagia, odyonophagia, early satiety or unintentional weight loss. +changes in stool  MSK: Negative for joint pain or swelling, back pain, and muscle pain. Derm: Negative for itching or rash Psych: Denies depression, anxiety, memory loss, confusion. No homicidal or suicidal ideation.  Heme: Negative for prolonged bleeding,  bruising easily, and swollen nodes. Endocrine: Negative for cold or heat intolerance, polyuria, polydipsia and goiter. Neuro: negative for tremor, gait imbalance, syncope and seizures. The remainder of the review of systems is noncontributory.  Physical Exam: BP 138/84 (BP Location: Right Arm, Patient Position: Sitting, Cuff Size: Large)   Pulse 72   Temp 98.2 F (36.8 C) (Oral)   Ht 5\' 11"  (1.803 m)   Wt 204 lb (92.5 kg)   BMI 28.45 kg/m  General:   Alert and oriented. No distress noted. Pleasant and cooperative.  Head:  Normocephalic and atraumatic. Eyes:  Conjuctiva clear without scleral icterus. Mouth:  Oral mucosa pink and moist. Good dentition. No lesions. Heart:  Normal rate and rhythm, s1 and s2 heart sounds present.  Lungs: Clear lung sounds in all lobes. Respirations equal and unlabored. Abdomen:  +BS, soft, non-tender and non-distended. No rebound or guarding. No HSM or masses noted. Derm: No palmar erythema or jaundice Msk:  Symmetrical without gross deformities. Normal posture. Extremities:  Without edema. Neurologic:  Alert and  oriented x4 Psych:  Alert and cooperative. Normal mood and affect.  Invalid input(s): 6 MONTHS   ASSESSMENT: Daniel [name] is a 65 y.o. male presenting today as new patient, for diarrhea.  Diarrhea started at the beginning of November, was occurring every 2-3 days, 1-2x/day, loose but not watery, he reports "flat" stools in between episodes of diarrhea. Now he is no longer having diarrhea, he is having one formed BM per day, but feels that he has to strain a lot to go and only has a small amount of stool on defecation, stools are skinnier in nature than normal. He denies any abdominal pain, weight loss, rectal bleeding, melena, nausea, or vomiting. Given the change in nature of his stools, we will proceed with colonoscopy for further evaluation, he will let me know if he develops any new or worsening GI symptoms.    PLAN:  Colonoscopy with  colowrap 2.  Continue with current diet 3. Stay well hydrated 4. Patient to let me know if he develops anymore diarrhea, new GI symptoms   Follow Up: TBD after colonoscopy  Elek Holderness L. Alver Sorrow, MSN, APRN, AGNP-C Adult-Gerontology Nurse Practitioner [facility]

## 2021-08-22 ENCOUNTER — Encounter (INDEPENDENT_AMBULATORY_CARE_PROVIDER_SITE_OTHER): Payer: Self-pay

## 2021-08-22 NOTE — Telephone Encounter (Signed)
LeighAnn Randilyn Foisy, CMA  

## 2021-08-24 ENCOUNTER — Encounter (HOSPITAL_COMMUNITY): Payer: Self-pay

## 2021-08-24 ENCOUNTER — Other Ambulatory Visit: Payer: Self-pay

## 2021-08-24 NOTE — Patient Instructions (Signed)
   Your procedure is scheduled on: 08/29/2021  Report to [facility] Entr[facility] at   8:45  AM.  Call this number if you have problems the morning of surgery: [phone]   Remember:              Follow Directions on the letter you received from Your Physician's office regarding the Bowel Prep              No Smoking the day of Procedure :   Take these medicines the morning of surgery with A SIP OF WATER: Levothyroxine, Bystolic   Do not wear jewelry, make-up or nail polish.    Do not bring valuables to the hospital.  Contacts, dentures or bridgework may not be worn into surgery.  .   Patients discharged the day of surgery will not be allowed to drive home.     Colonoscopy, Adult, Care After This sheet gives you information about how to care for yourself after your procedure. Your health care provider may also give you more specific instructions. If you have problems or questions, contact your health care provider. What can I expect after the procedure? After the procedure, it is common to have: A small amount of blood in your stool for 24 hours after the procedure. Some gas. Mild abdominal cramping or bloating.  Follow these instructions at home: General instructions  For the first 24 hours after the procedure: Do not drive or use machinery. Do not sign important documents. Do not drink alcohol. Do your regular daily activities at a slower pace than normal. Eat soft, easy-to-digest foods. Rest often. Take over-the-counter or prescription medicines only as told by your health care provider. It is up to you to get the results of your procedure. Ask your health care provider, or the department performing the procedure, when your results will be ready. Relieving cramping and bloating Try walking around when you have cramps or feel bloated. Apply heat to your abdomen as told by your health care provider. Use a heat source that your health care provider recommends, such as a  moist heat pack or a heating pad. Place a towel between your skin and the heat source. Leave the heat on for 20-30 minutes. Remove the heat if your skin turns bright red. This is especially important if you are unable to feel pain, heat, or cold. You may have a greater risk of getting burned. Eating and drinking Drink enough fluid to keep your urine clear or pale yellow. Resume your normal diet as instructed by your health care provider. Avoid heavy or fried foods that are hard to digest. Avoid drinking alcohol for as long as instructed by your health care provider. Contact a health care provider if: You have blood in your stool 2-3 days after the procedure. Get help right away if: You have more than a small spotting of blood in your stool. You pass large blood clots in your stool. Your abdomen is swollen. You have nausea or vomiting. You have a fever. You have increasing abdominal pain that is not relieved with medicine. This information is not intended to replace advice given to you by your health care provider. Make sure you discuss any questions you have with your health care provider. Document Released: 04/17/2004 Document Revised: 05/28/2016 Document Reviewed: 11/15/2015 Elsevier Interactive Patient Education  [employer].

## 2021-08-25 ENCOUNTER — Encounter (HOSPITAL_COMMUNITY)
Admission: RE | Admit: 2021-08-25 | Discharge: 2021-08-25 | Disposition: A | Discharge status: Home / Self Care | Payer: Medicare Other | Source: Ambulatory Visit | Attending: Gastroenterology | Admitting: Gastroenterology

## 2021-08-29 ENCOUNTER — Ambulatory Visit (HOSPITAL_COMMUNITY): Payer: Medicare Other | Admitting: Anesthesiology

## 2021-08-29 ENCOUNTER — Other Ambulatory Visit: Payer: Self-pay

## 2021-08-29 ENCOUNTER — Ambulatory Visit (HOSPITAL_COMMUNITY)
Admission: RE | Admit: 2021-08-29 | Discharge: 2021-08-29 | Disposition: A | Discharge status: Home / Self Care | Payer: Medicare Other | Attending: Gastroenterology | Admitting: Gastroenterology

## 2021-08-29 ENCOUNTER — Encounter (HOSPITAL_COMMUNITY): Payer: Self-pay | Admitting: Gastroenterology

## 2021-08-29 ENCOUNTER — Encounter (HOSPITAL_COMMUNITY)
Admission: RE | Disposition: A | Discharge status: Home / Self Care | Payer: Self-pay | Source: Home / Self Care | Attending: Gastroenterology

## 2021-08-29 DIAGNOSIS — Z79899 Other long term (current) drug therapy: Secondary | ICD-10-CM | POA: Diagnosis not present

## 2021-08-29 DIAGNOSIS — K5730 Diverticulosis of large intestine without perforation or abscess without bleeding: Secondary | ICD-10-CM | POA: Diagnosis not present

## 2021-08-29 DIAGNOSIS — D123 Benign neoplasm of transverse colon: Secondary | ICD-10-CM | POA: Diagnosis not present

## 2021-08-29 DIAGNOSIS — E039 Hypothyroidism, unspecified: Secondary | ICD-10-CM | POA: Diagnosis not present

## 2021-08-29 DIAGNOSIS — R195 Other fecal abnormalities: Secondary | ICD-10-CM | POA: Diagnosis not present

## 2021-08-29 DIAGNOSIS — K648 Other hemorrhoids: Secondary | ICD-10-CM | POA: Diagnosis not present

## 2021-08-29 DIAGNOSIS — I10 Essential (primary) hypertension: Secondary | ICD-10-CM | POA: Diagnosis not present

## 2021-08-29 DIAGNOSIS — Z87891 Personal history of nicotine dependence: Secondary | ICD-10-CM | POA: Insufficient documentation

## 2021-08-29 DIAGNOSIS — D125 Benign neoplasm of sigmoid colon: Secondary | ICD-10-CM | POA: Insufficient documentation

## 2021-08-29 DIAGNOSIS — R194 Change in bowel habit: Secondary | ICD-10-CM | POA: Diagnosis not present

## 2021-08-29 HISTORY — PX: COLONOSCOPY WITH PROPOFOL: SHX5780

## 2021-08-29 HISTORY — PX: BIOPSY: SHX5522

## 2021-08-29 HISTORY — PX: POLYPECTOMY: SHX5525

## 2021-08-29 LAB — GLUCOSE, CAPILLARY: Glucose-Capillary: 88 mg/dL (ref 70–99)

## 2021-08-29 LAB — HM COLONOSCOPY

## 2021-08-29 SURGERY — COLONOSCOPY WITH PROPOFOL
Anesthesia: General

## 2021-08-29 MED ORDER — STERILE WATER FOR IRRIGATION IR SOLN
Status: DC | PRN
  Administered 2021-08-29: 60 mL

## 2021-08-29 MED ORDER — PROPOFOL 1000 MG/100ML IV EMUL
INTRAVENOUS | Status: AC
  Filled 2021-08-29: qty 100

## 2021-08-29 MED ORDER — PROPOFOL 10 MG/ML IV BOLUS
INTRAVENOUS | Status: DC | PRN
  Administered 2021-08-29: 50 mg via INTRAVENOUS

## 2021-08-29 MED ORDER — LACTATED RINGERS IV SOLN: INTRAVENOUS | Status: DC

## 2021-08-29 MED ORDER — PHENYLEPHRINE HCL (PRESSORS) 10 MG/ML IV SOLN
INTRAVENOUS | Status: DC | PRN
  Administered 2021-08-29 (×6): 100 ug via INTRAVENOUS

## 2021-08-29 MED ORDER — PHENYLEPHRINE 40 MCG/ML (10ML) SYRINGE FOR IV PUSH (FOR BLOOD PRESSURE SUPPORT)
PREFILLED_SYRINGE | INTRAVENOUS | Status: AC
  Filled 2021-08-29: qty 20

## 2021-08-29 MED ORDER — PROPOFOL 500 MG/50ML IV EMUL
INTRAVENOUS | Status: DC | PRN
  Administered 2021-08-29: 150 ug/kg/min via INTRAVENOUS

## 2021-08-29 NOTE — Discharge Instructions (Signed)
You are being discharged to home.  Resume your previous diet.  We are waiting for your pathology results.  Your physician has recommended a repeat colonoscopy for surveillance based on pathology results.  

## 2021-08-29 NOTE — Transfer of Care (Signed)
Immediate Anesthesia Transfer of Care Note  Patient: Daniel [name]  Procedure(s) Performed: COLONOSCOPY WITH PROPOFOL BIOPSY POLYPECTOMY  Patient Location: PACU  Anesthesia Type:General  Level of Consciousness: awake, alert , oriented and patient cooperative  Airway & Oxygen Therapy: Patient Spontanous Breathing  Post-op Assessment: Report given to RN, Post -op Vital signs reviewed and stable and Patient moving all extremities X 4  Post vital signs: Reviewed and stable  Last Vitals:  Vitals Value Taken Time  BP 113/68 08/29/21 1103  Temp 36.4 C 08/29/21 1103  Pulse 65 08/29/21 1103  Resp 10 08/29/21 1103  SpO2 96 % 08/29/21 1103    Last Pain:  Vitals:   08/29/21 1103  TempSrc: Oral  PainSc: Asleep         Complications: No notable events documented.

## 2021-08-29 NOTE — Anesthesia Preprocedure Evaluation (Signed)
Anesthesia Evaluation  Patient identified by MRN, date of birth, ID band Patient awake    Reviewed: Allergy & Precautions, NPO status , Patient's Chart, lab work & pertinent test results, reviewed documented beta blocker date and time   Airway Mallampati: II  TM Distance: >3 FB Neck ROM: Full    Dental  (+) Dental Advisory Given, Caps   Pulmonary former smoker,    Pulmonary exam normal breath sounds clear to auscultation       Cardiovascular Exercise Tolerance: Good hypertension, Pt. on medications and Pt. on home beta blockers + Peripheral Vascular Disease  Normal cardiovascular exam Rhythm:Regular Rate:Normal     Neuro/Psych PSYCHIATRIC DISORDERS Anxiety    GI/Hepatic negative GI ROS, (+)     substance abuse  alcohol use,   Endo/Other  Hypothyroidism   Renal/GU negative Renal ROS     Musculoskeletal negative musculoskeletal ROS (+)   Abdominal   Peds  Hematology negative hematology ROS (+)   Anesthesia Other Findings   Reproductive/Obstetrics negative OB ROS                            Anesthesia Physical Anesthesia Plan  ASA: 2  Anesthesia Plan: General   Post-op Pain Management: Minimal or no pain anticipated   Induction: Intravenous  PONV Risk Score and Plan: TIVA  Airway Management Planned: Nasal Cannula and Natural Airway  Additional Equipment:   Intra-op Plan:   Post-operative Plan:   Informed Consent: I have reviewed the patients History and Physical, chart, labs and discussed the procedure including the risks, benefits and alternatives for the proposed anesthesia with the patient or authorized representative who has indicated his/her understanding and acceptance.     Dental advisory given  Plan Discussed with: CRNA and Surgeon  Anesthesia Plan Comments:         Anesthesia Quick Evaluation

## 2021-08-29 NOTE — Interval H&P Note (Signed)
History and Physical Interval Note:  08/29/2021 9:54 AM  Daniel [name]  has presented today for surgery, with the diagnosis of changes in stool.  The various methods of treatment have been discussed with the patient and family. After consideration of risks, benefits and other options for treatment, the patient has consented to  Procedure(s) with comments: COLONOSCOPY WITH PROPOFOL (N/A) - 1200 as a surgical intervention.  The patient's history has been reviewed, patient examined, no change in status, stable for surgery.  I have reviewed the patient's chart and labs.  Questions were answered to the patient's satisfaction.     [name]

## 2021-08-29 NOTE — Op Note (Addendum)
[facility] Patient Name: Daniel [name] Procedure Date: 08/29/2021 10:29 AM MRN: [id number] Date of Birth: 1956/01/14 Attending MD: Maylon Peppers ,  CSN: [id number] Age: 65 Admit Type: Outpatient Procedure:                Colonoscopy Indications:              Change in bowel habits, Change in stool caliber Providers:                Maylon Peppers, Janeece Riggers, RN, Raphael Gibney,                            Technician Referring MD:              Medicines:                Monitored Anesthesia Care Complications:            No immediate complications. Estimated Blood Loss:     Estimated blood loss: none. Procedure:                Pre-Anesthesia Assessment:                           - Prior to the procedure, a History and Physical                            was performed, and patient medications, allergies                            and sensitivities were reviewed. The patient's                            tolerance of previous anesthesia was reviewed.                           - The risks and benefits of the procedure and the                            sedation options and risks were discussed with the                            patient. All questions were answered and informed                            consent was obtained.                           - ASA Grade Assessment: II - A patient with mild                            systemic disease.                           After obtaining informed consent, the colonoscope                            was passed under direct vision. Throughout the  procedure, the patient's blood pressure, pulse, and                            oxygen saturations were monitored continuously. The                            [id number]-HQ190L ([id number]) scope was introduced through                            the anus and advanced to the the cecum, identified                            by appendiceal orifice and ileocecal valve. The                             colonoscopy was performed without difficulty. The                            patient tolerated the procedure well. The quality                            of the bowel preparation was excellent. Scope In: 10:30:18 AM Scope Out: 10:57:29 AM Scope Withdrawal Time: 0 hours 19 minutes 32 seconds  Total Procedure Duration: 0 hours 27 minutes 11 seconds  Findings:      The perianal and digital rectal examinations were normal.      Three sessile polyps were found in the sigmoid colon and transverse       colon. The polyps were 4 to 5 mm in size. These polyps were removed with       a cold snare. Resection and retrieval were complete.      A few small-mouthed diverticula were found in the sigmoid colon.       Biopsies for histology were taken from the normal colon with a cold       forceps from the right colon and left colon for evaluation of       microscopic colitis.      Non-bleeding internal hemorrhoids were found during retroflexion. The       hemorrhoids were small. Impression:               - Three 4 to 5 mm polyps in the sigmoid colon and                            in the transverse colon, removed with a cold snare.                            Resected and retrieved.                           - Diverticulosis in the sigmoid colon. Biopsied                            normal colon.                           - Non-bleeding  internal hemorrhoids. Moderate Sedation:      Per Anesthesia Care Recommendation:           - Discharge patient to home (ambulatory).                           - Resume previous diet.                           - Await pathology results.                           - Repeat colonoscopy for surveillance based on                            pathology results. Procedure Code(s):        --- Professional ---                           [phone], Colonoscopy, flexible; with removal of                            tumor(s), polyp(s), or other lesion(s) by snare                             technique                           45380, 29, Colonoscopy, flexible; with biopsy,                            single or multiple Diagnosis Code(s):        --- Professional ---                           K63.5, Polyp of colon                           K64.8, Other hemorrhoids                           R19.4, Change in bowel habit                           R19.5, Other fecal abnormalities                           K57.30, Diverticulosis of large intestine without                            perforation or abscess without bleeding CPT copyright 2019 American Medical Association. All rights reserved. The codes documented in this report are preliminary and upon coder review may  be revised to meet current compliance requirements. Maylon Peppers, MD Maylon Peppers,  08/29/2021 11:06:35 AM This report has been signed electronically. Number of Addenda: 0

## 2021-08-29 NOTE — Anesthesia Postprocedure Evaluation (Signed)
Anesthesia Post Note  Patient: Daniel [name]  Procedure(s) Performed: COLONOSCOPY WITH PROPOFOL BIOPSY POLYPECTOMY  Patient location during evaluation: Phase II Anesthesia Type: General Level of consciousness: awake and alert and oriented Pain management: pain level controlled Vital Signs Assessment: post-procedure vital signs reviewed and stable Respiratory status: spontaneous breathing, nonlabored ventilation and respiratory function stable Cardiovascular status: blood pressure returned to baseline and stable Postop Assessment: no apparent nausea or vomiting Anesthetic complications: no   No notable events documented.   Last Vitals:  Vitals:   08/29/21 1103 08/29/21 1114  BP: 113/68 98/68  Pulse: 65 73  Resp: 10 14  Temp: (!) 36.4 C   SpO2: 96% 99%    Last Pain:  Vitals:   08/29/21 1103  TempSrc: Oral  PainSc: Asleep                 Francisca Langenderfer C Wilbur [place]

## 2021-08-30 LAB — SURGICAL PATHOLOGY

## 2021-08-31 ENCOUNTER — Encounter (INDEPENDENT_AMBULATORY_CARE_PROVIDER_SITE_OTHER): Payer: Self-pay | Admitting: *Deleted

## 2021-09-01 ENCOUNTER — Encounter (HOSPITAL_COMMUNITY): Payer: Self-pay | Admitting: Gastroenterology

## 2021-10-17 DIAGNOSIS — J069 Acute upper respiratory infection, unspecified: Secondary | ICD-10-CM | POA: Diagnosis not present

## 2021-10-23 ENCOUNTER — Ambulatory Visit
Admission: EM | Admit: 2021-10-23 | Discharge: 2021-10-23 | Disposition: A | Discharge status: Home / Self Care | Payer: Medicare Other | Attending: Family Medicine | Admitting: Family Medicine

## 2021-10-23 ENCOUNTER — Other Ambulatory Visit: Payer: Self-pay

## 2021-10-23 DIAGNOSIS — J0100 Acute maxillary sinusitis, unspecified: Secondary | ICD-10-CM

## 2021-10-23 DIAGNOSIS — R062 Wheezing: Secondary | ICD-10-CM

## 2021-10-23 DIAGNOSIS — J209 Acute bronchitis, unspecified: Secondary | ICD-10-CM | POA: Diagnosis not present

## 2021-10-23 MED ORDER — DEXAMETHASONE SODIUM PHOSPHATE 10 MG/ML IJ SOLN
10.0000 mg | Freq: Once | INTRAMUSCULAR | Status: AC
  Administered 2021-10-23: 10 mg via INTRAMUSCULAR

## 2021-10-23 MED ORDER — ALBUTEROL SULFATE HFA 108 (90 BASE) MCG/ACT IN AERS: 1.0000 | INHALATION_SPRAY | Freq: Four times a day (QID) | RESPIRATORY_TRACT | 0 refills | Status: DC | PRN

## 2021-10-23 MED ORDER — DOXYCYCLINE HYCLATE 100 MG PO CAPS: 100.0000 mg | ORAL_CAPSULE | Freq: Two times a day (BID) | ORAL | 0 refills | Status: DC

## 2021-10-23 NOTE — ED Provider Notes (Signed)
[facility]    CSN: [id number] Arrival date & time: 10/23/21  1453      History   Chief Complaint Chief Complaint  Patient presents with   Cough   Nasal Congestion    HPI Daniel [name] is a 66 y.o. male.   Presenting today with 1-1/2 weeks of nasal congestion, facial pain and pressure, sinus headache, postnasal drainage, hacking productive cough, chest tightness, wheezing.  Denies fever, chills, chest pain, shortness of breath, abdominal pain, nausea vomiting or diarrhea.  Was treated with steroids last week with no improvement in symptoms.  Also taking Advil Cold and Sinus with no relief.  No known history of chronic pulmonary disease.   Past Medical History:  Diagnosis Date   Anxiety    Bilateral renal artery stenosis (HCC)    History of tobacco abuse    Hyperlipidemia    Hypertension    Hypothyroidism    Palpitations    Thyroid disease     Patient Active Problem List   Diagnosis Date Noted   Change in stool caliber 08/21/2021   Atypical chest pain 11/18/2019   Bilateral renal artery stenosis ([place]) 10/05/2015   Palpitations 10/26/2014   Essential hypertension 07/17/2013   Hyperlipidemia 07/17/2013   Hypertension     Past Surgical History:  Procedure Laterality Date   BIOPSY  08/29/2021   Procedure: BIOPSY;  Surgeon: Harvel Quale, MD;  [facility]: [facility];  Service: Gastroenterology;;   COLONOSCOPY N/A 07/11/2016   Procedure: COLONOSCOPY;  Surgeon: Rogene Houston, MD;  Location: [facility];  Service: Endoscopy;  Laterality: N/A;  930   COLONOSCOPY WITH PROPOFOL N/A 08/29/2021   Procedure: COLONOSCOPY WITH PROPOFOL;  Surgeon: Harvel Quale, MD;  Location: [facility];  Service: Gastroenterology;  Laterality: N/A;  1200   EYE SURGERY     MENISCUS REPAIR Left 05/29/13   POLYPECTOMY  07/11/2016   Procedure: POLYPECTOMY;  Surgeon: Rogene Houston, MD;  Location: [facility];  Service: Endoscopy;;  colon    POLYPECTOMY  08/29/2021   Procedure: POLYPECTOMY;  Surgeon: Harvel Quale, MD;  Location: [facility];  Service: Gastroenterology;;   US ECHOCARDIOGRAPHY  07/18/2012       Home Medications    Prior to Admission medications   Medication Sig Start Date End Date Taking? Authorizing Provider  albuterol (VENTOLIN HFA) 108 (90 Base) MCG/ACT inhaler Inhale 1-2 puffs into the lungs every 6 (six) hours as needed for wheezing or shortness of breath. 10/23/21  Yes Volney American, PA-C  doxycycline (VIBRAMYCIN) 100 MG capsule Take 1 capsule (100 mg total) by mouth 2 (two) times daily. 10/23/21  Yes Volney American, PA-C  escitalopram (LEXAPRO) 10 MG tablet Take 10 mg by mouth at bedtime.     [provider]  levothyroxine (SYNTHROID, LEVOTHROID) 125 MCG tablet Take 62.5-125 mcg by mouth See admin instructions. Take 125 mcg by mouth Monday-Friday and take 62.5 mcg on Saturday and Sunday.    [provider]  nebivolol (BYSTOLIC) 5 MG tablet Take 1 tablet (5 mg total) by mouth every morning. Patient taking differently: Take 2.5 mg by mouth every morning. 02/13/19   Deberah Pelton, NP  rosuvastatin (CRESTOR) 10 MG tablet Take 1 tablet (10 mg) by mouth daily 05/12/21   Lorretta Harp, MD  cetirizine (ZYRTEC ALLERGY) 10 MG tablet Take 1 tablet (10 mg total) by mouth daily. 07/19/20 12/03/20  Avegno, Darrelyn Hillock, FNP  fluticasone (FLONASE) 50 MCG/ACT nasal  spray Place 1 spray into both nostrils daily for 14 days. 07/19/20 12/03/20  AvegnoDarrelyn Hillock, FNP    Family History Family History  Problem Relation Age of Onset   Hyperlipidemia Father    Hypertension Father    Stroke Father    CAD Maternal Grandmother    Heart attack Maternal Grandfather    Cancer Mother    Diabetes Mother    Hypertension Mother     Social History Social History   Tobacco Use   Smoking status: Former    Packs/day: 2.00    Years: 40.00    Pack years: 80.00    Types: Cigarettes     Quit date: 12/16/2010    Years since quitting: 10.8   Smokeless tobacco: Current    Types: Snuff   Tobacco comments:    snus  Vaping Use   Vaping Use: Never used  Substance Use Topics   Alcohol use: Yes    Alcohol/week: 28.0 standard drinks    Types: 28 Standard drinks or equivalent per week    Comment: beer 2 -5 per day- none for last few weeks as of 08/24/2021   Drug use: No     Allergies   Patient has no known allergies.   Review of Systems Review of Systems Per HPI  Physical Exam Triage Vital Signs ED Triage Vitals  Enc Vitals Group     BP 10/23/21 1529 (!) 170/67     Pulse Rate 10/23/21 1529 83     Resp 10/23/21 1529 20     Temp 10/23/21 1529 98.9 F (37.2 C)     Temp src --      SpO2 10/23/21 1529 96 %     Weight --      Height --      Head Circumference --      Peak Flow --      Pain Score 10/23/21 1528 8     Pain Loc --      Pain Edu? --      Excl. in [place]? --    No data found.  Updated Vital Signs BP (!) 170/67    Pulse 83    Temp 98.9 F (37.2 C)    Resp 20    SpO2 96%   Visual Acuity Right Eye Distance:   Left Eye Distance:   Bilateral Distance:    Right Eye Near:   Left Eye Near:    Bilateral Near:     Physical Exam Vitals and nursing note reviewed.  Constitutional:      Appearance: He is well-developed.  HENT:     Head: Atraumatic.     Right Ear: External ear normal.     Left Ear: External ear normal.     Nose: Congestion present.     Mouth/Throat:     Mouth: Mucous membranes are moist.     Pharynx: Posterior oropharyngeal erythema present. No oropharyngeal exudate.  Eyes:     Conjunctiva/sclera: Conjunctivae normal.     Pupils: Pupils are equal, round, and reactive to light.  Cardiovascular:     Rate and Rhythm: Normal rate and regular rhythm.  Pulmonary:     Effort: Pulmonary effort is normal. No respiratory distress.     Breath sounds: Wheezing present. No rales.  Musculoskeletal:        General: Normal range of motion.      Cervical back: Normal range of motion and neck supple.  Lymphadenopathy:     Cervical: No cervical adenopathy.  Skin:    General: Skin is warm and dry.  Neurological:     Mental Status: He is alert and oriented to person, place, and time.  Psychiatric:        Behavior: Behavior normal.     UC Treatments / Results  Labs (all labs ordered are listed, but only abnormal results are displayed) Labs Reviewed - No data to display  EKG   Radiology No results found.  Procedures Procedures (including critical care time)  Medications Ordered in UC Medications  dexamethasone (DECADRON) injection 10 mg (has no administration in time range)    Initial Impression / Assessment and Plan / UC Course  I have reviewed the triage vital signs and the nursing notes.  Pertinent labs & imaging results that were available during my care of the patient were reviewed by me and considered in my medical decision making (see chart for details).     Treat with Augmentin, IM Decadron, albuterol inhaler, continued over-the-counter supportive medications and home care.  Return for acutely worsening symptoms.  Final Clinical Impressions(s) / UC Diagnoses   Final diagnoses:  Acute non-recurrent maxillary sinusitis  Acute bronchitis, unspecified organism  Wheezing   Discharge Instructions   None    ED Prescriptions     Medication Sig Dispense Auth. Provider   albuterol (VENTOLIN HFA) 108 (90 Base) MCG/ACT inhaler Inhale 1-2 puffs into the lungs every 6 (six) hours as needed for wheezing or shortness of breath. 18 g Volney American, [place]   doxycycline (VIBRAMYCIN) 100 MG capsule Take 1 capsule (100 mg total) by mouth 2 (two) times daily. 14 capsule Volney American, [place]      PDMP not reviewed this encounter.   Volney American, [place] 10/23/21 1551

## 2021-10-23 NOTE — ED Triage Notes (Signed)
Pt presents with cough and nasal congestion for over a week, was treated with steroids last week with no improvement, also has facial pain

## 2021-11-09 DIAGNOSIS — E782 Mixed hyperlipidemia: Secondary | ICD-10-CM | POA: Diagnosis not present

## 2021-11-09 LAB — LIPID PANEL

## 2021-11-10 LAB — HEPATIC FUNCTION PANEL
ALT: 21 IU/L (ref 0–44)
AST: 21 IU/L (ref 0–40)
Albumin: 4.5 g/dL (ref 3.8–4.8)
Alkaline Phosphatase: 70 IU/L (ref 44–121)
Bilirubin Total: 0.4 mg/dL (ref 0.0–1.2)
Bilirubin, Direct: 0.12 mg/dL (ref 0.00–0.40)
Total Protein: 7 g/dL (ref 6.0–8.5)

## 2021-11-10 LAB — LIPID PANEL
Chol/HDL Ratio: 3.3 ratio (ref 0.0–5.0)
Cholesterol, Total: 168 mg/dL (ref 100–199)
HDL: 51 mg/dL (ref >39)
LDL Chol Calc (NIH): 94 mg/dL (ref 0–99)
Triglycerides: 130 mg/dL (ref 0–149)
VLDL Cholesterol Cal: 23 mg/dL (ref 5–40)

## 2021-11-14 ENCOUNTER — Encounter: Payer: Self-pay | Admitting: Cardiovascular Disease

## 2021-11-14 ENCOUNTER — Other Ambulatory Visit: Payer: Self-pay

## 2021-11-14 ENCOUNTER — Ambulatory Visit: Payer: Medicare Other | Admitting: Cardiovascular Disease

## 2021-11-14 DIAGNOSIS — E782 Mixed hyperlipidemia: Secondary | ICD-10-CM

## 2021-11-14 DIAGNOSIS — R002 Palpitations: Secondary | ICD-10-CM

## 2021-11-14 DIAGNOSIS — I10 Essential (primary) hypertension: Secondary | ICD-10-CM | POA: Diagnosis not present

## 2021-11-14 DIAGNOSIS — R931 Abnormal findings on diagnostic imaging of heart and coronary circulation: Secondary | ICD-10-CM | POA: Diagnosis not present

## 2021-11-14 MED ORDER — ROSUVASTATIN CALCIUM 20 MG PO TABS: ORAL_TABLET | ORAL | 3 refills | Status: DC

## 2021-11-14 NOTE — Progress Notes (Signed)
11/14/2021 Daniel [name]   December 31, 1955  [id number]  Primary Physician Sharilyn Sites, MD Primary Cardiologist: Lorretta Harp MD Daniel [name], Daniel [name]  HPI:  Daniel [name] is a 66 y.o.  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 I last saw him in the office 05/12/2021.Daniel KitchenHe was previously a patient of Dr. Terance Ice. I apparently took care of his father prior to his death. His cardiac risk factor profile is positive for 80 pack years of tobacco abuse having quit 7  years ago, treated hypertension and mild hyperlipidemia. He has never had a heart for stroke. He denies chest pain or shortness of breath. He had a negative Myoview stress test 07/18/12 a normal 2-D echo at that time. His major complaint is of palpitations. This has been evaluated in the past with an event monitor, 2-D echo and Myoview. 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 He does admit to excessive caffeine or alcohol intake. Also has hypothyroidism on thyroid replacement therapy. Apparently has values were elevated and his primary care physician is titrating his thyroid replacement. He does complain of bilateral leg; claudication with lower extremity Dopplers Were performed 01/03/16 which were entirely normal.    He was complaining of palpitations prior to my my office visit several years ago and saw Almyra Deforest PA-C who titrated his beta-blocker resulting in improvement in his symptoms.     He had a coronary CTA performed 10/22 5/20 revealing no hemodynamically significant lesions with all FFR greater than 0.8.  He has had more palpitations recently.  His primary care physician cut his Bystolic in half because of bradycardia.  He does think that anxiety has a big effect on his palpitations however.  Since I saw him in the office 6 months ago he continues to do well.  He does get episodes of  tachypalpitations on an daily basis more recently but these are fairly brief and do not really have any other clinical symptoms.  He is on low-dose Bystolic.  He eliminated caffeine from his diet.   Current Meds  Medication Sig   escitalopram (LEXAPRO) 10 MG tablet Take 10 mg by mouth at bedtime.    levothyroxine (SYNTHROID, LEVOTHROID) 125 MCG tablet Take 62.5-125 mcg by mouth See admin instructions. Take 125 mcg by mouth Monday-Friday and take 62.5 mcg on Saturday and Sunday.   nebivolol (BYSTOLIC) 5 MG tablet Take 1 tablet (5 mg total) by mouth every morning. (Patient taking differently: Take 2.5 mg by mouth every morning.)   rosuvastatin (CRESTOR) 10 MG tablet Take 1 tablet (10 mg) by mouth daily     No Known Allergies  Social History   Socioeconomic History   Marital status: Married    Spouse name: Not on file   Number of children: Not on file   Years of education: Not on file   Highest education level: Not on file  Occupational History   Not on file  Tobacco Use   Smoking status: Former    Packs/day: 2.00    Years: 40.00    Pack years: 80.00    Types: Cigarettes    Quit date: 12/16/2010    Years since quitting: 10.9   Smokeless tobacco: Current    Types: Snuff   Tobacco comments:    snus  Vaping Use   Vaping Use: Never used  Substance and Sexual Activity  Alcohol use: Yes    Alcohol/week: 28.0 standard drinks    Types: 28 Standard drinks or equivalent per week    Comment: beer 2 -5 per day- none for last few weeks as of 08/24/2021   Drug use: No   Sexual activity: Yes  Other Topics Concern   Not on file  Social History Narrative   Not on file   Social Determinants of Health   Financial Resource Strain: Not on file  Food Insecurity: Not on file  Transportation Needs: Not on file  Physical Activity: Not on file  Stress: Not on file  Social Connections: Not on file  Intimate Partner Violence: Not on file     Review of Systems: General: negative for  chills, fever, night sweats or weight changes.  Cardiovascular: negative for chest pain, dyspnea on exertion, edema, orthopnea, palpitations, paroxysmal nocturnal dyspnea or shortness of breath Dermatological: negative for rash Respiratory: negative for cough or wheezing Urologic: negative for hematuria Abdominal: negative for nausea, vomiting, diarrhea, bright red blood per rectum, melena, or hematemesis Neurologic: negative for visual changes, syncope, or dizziness All other systems reviewed and are otherwise negative except as noted above.    Blood pressure 126/66, pulse 65, height 5\' 11"  (1.803 m), weight 207 lb 12.8 oz (94.3 kg), SpO2 97 %.  General appearance: alert and no distress Neck: no adenopathy, no carotid bruit, no JVD, supple, symmetrical, trachea midline, and thyroid not enlarged, symmetric, no tenderness/mass/nodules Lungs: clear to auscultation bilaterally Heart: regular rate and rhythm, S1, S2 normal, no murmur, click, rub or gallop Extremities: extremities normal, atraumatic, no cyanosis or edema Pulses: 2+ and symmetric Skin: Skin color, texture, turgor normal. No rashes or lesions Neurologic: Grossly normal  EKG sinus rhythm at 65 without ST or T wave changes.  Personally reviewed this EKG.  ASSESSMENT AND PLAN:   Hypertension .  He is on low-dose Bystolic.History of essential hypertension a blood pressure measured today at 126/66  Hyperlipidemia History of hyperlipidemia on low-dose Crestor followed by his PCP.  His most recent lipid profile performed 11/09/2021 revealed total cholesterol 168, LDL of 94 and HDL 51.  Because of his elevated coronary calcium score and CAD by coronary CTA although not significant, I do want his LDL less than 70.  I am going to increase his rosuvastatin from 10 to 20 mg a day and we will recheck a lipid liver profile in 3 months.  Palpitations History of palpitations with event monitor performed 05/12/2021 showing occasional PACs and  PVCs and short runs of SVT with rates up to 200 bpm.  These occur recently on a daily basis but he says they are brief and do not affect his quality of life.  He has limited caffeine from his diet.  He is on low-dose Bystolic which is helped.  When its been up to 5 mg a day he gets symptomatically bradycardic.  Elevated coronary artery calcium score Coronary calcium score performed 07/08/2019 was 1129 with no evidence of hemodynamically significant stenoses on FFR analysis.     Lorretta Harp MD FACP,FACC,FAHA, [facility] 11/14/2021 11:10 AM

## 2021-11-14 NOTE — Assessment & Plan Note (Signed)
Coronary calcium score performed 07/08/2019 was 1129 with no evidence of hemodynamically significant stenoses on FFR analysis.

## 2021-11-14 NOTE — Assessment & Plan Note (Signed)
.    He is on low-dose Bystolic.History of essential hypertension a blood pressure measured today at 126/66

## 2021-11-14 NOTE — Assessment & Plan Note (Signed)
History of palpitations with event monitor performed 05/12/2021 showing occasional PACs and PVCs and short runs of SVT with rates up to 200 bpm.  These occur recently on a daily basis but he says they are brief and do not affect his quality of life.  He has limited caffeine from his diet.  He is on low-dose Bystolic which is helped.  When its been up to 5 mg a day he gets symptomatically bradycardic.

## 2021-11-14 NOTE — Patient Instructions (Signed)
Medication Instructions:   -Increase rosuvastatin (crestor) to 20mg  once daily.  *If you need a refill on your cardiac medications before your next appointment, please call your pharmacy*   Lab Work: Your physician recommends that you return for lab work in: 3 months for FASTING lipid/liver profile.  If you have labs (blood work) drawn today and your tests are completely normal, you will receive your results only by: [place] (if you have MyChart) OR A paper copy in the mail If you have any lab test that is abnormal or we need to change your treatment, we will call you to review the results.   Follow-Up: At [facility], you and your health needs are our priority.  As part of our continuing mission to provide you with exceptional heart care, we have created designated Provider Care Teams.  These Care Teams include your primary Cardiologist (physician) and Advanced Practice Providers (APPs -  Physician Assistants and Nurse Practitioners) who all work together to provide you with the care you need, when you need it.  We recommend signing up for the patient portal called "MyChart".  Sign up information is provided on this After Visit Summary.  MyChart is used to connect with patients for Virtual Visits (Telemedicine).  Patients are able to view lab/test results, encounter notes, upcoming appointments, etc.  Non-urgent messages can be sent to your provider as well.   To learn more about what you can do with MyChart, go to [email or web address].    Your next appointment:   6 month(s)  The format for your next appointment:   In Person  Provider:   Coletta Memos, FNP, Fabian Sharp, PA-C, Sande Rives, PA-C, Caron Presume, PA-C, Jory Sims, DNP, ANP, or Almyra Deforest, PA-C     Then, Quay Burow, MD will plan to see you again in 12 month(s).

## 2021-11-14 NOTE — Assessment & Plan Note (Addendum)
History of hyperlipidemia on low-dose Crestor followed by his PCP.  His most recent lipid profile performed 11/09/2021 revealed total cholesterol 168, LDL of 94 and HDL 51.  Because of his elevated coronary calcium score and CAD by coronary CTA although not significant, I do want his LDL less than 70.  I am going to increase his rosuvastatin from 10 to 20 mg a day and we will recheck a lipid liver profile in 3 months.

## 2021-11-29 DIAGNOSIS — H5203 Hypermetropia, bilateral: Secondary | ICD-10-CM | POA: Diagnosis not present

## 2021-11-30 DIAGNOSIS — E039 Hypothyroidism, unspecified: Secondary | ICD-10-CM | POA: Diagnosis not present

## 2021-11-30 DIAGNOSIS — I10 Essential (primary) hypertension: Secondary | ICD-10-CM | POA: Diagnosis not present

## 2021-11-30 IMAGING — CT CT CHEST LUNG CANCER SCREENING LOW DOSE W/O CM
1 series · 10 of 10 positions shown, 13 images · non-contrast
Comparison: 03/15/2020

CLINICAL DATA: 64-year-old male with 60 pack-year history of
smoking. Lung cancer screening.

EXAM:
CT CHEST WITHOUT CONTRAST LOW-DOSE FOR LUNG CANCER SCREENING
TECHNIQUE: Multidetector CT imaging of the chest was performed following the
standard protocol without IV contrast.

[ct lung segmentation data · axial · 0.72mm/px · z∈[+66,+66]mm · 10 of 332 frames shown]
[frame 1/332  mediastinal]
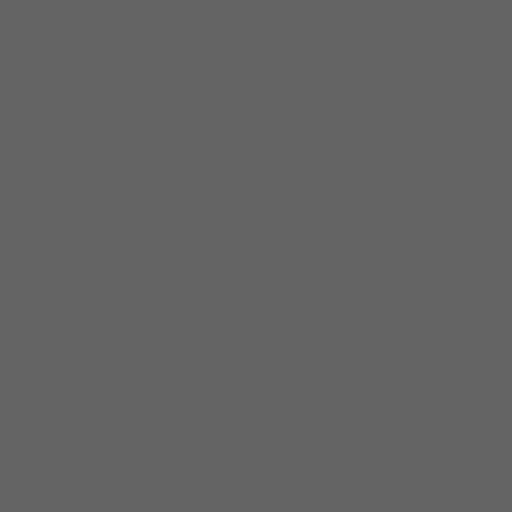
[frame 1/332  lung]
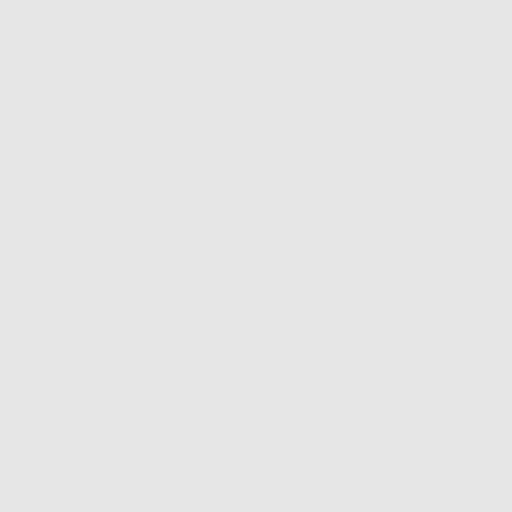
[frame 37/332  lung]
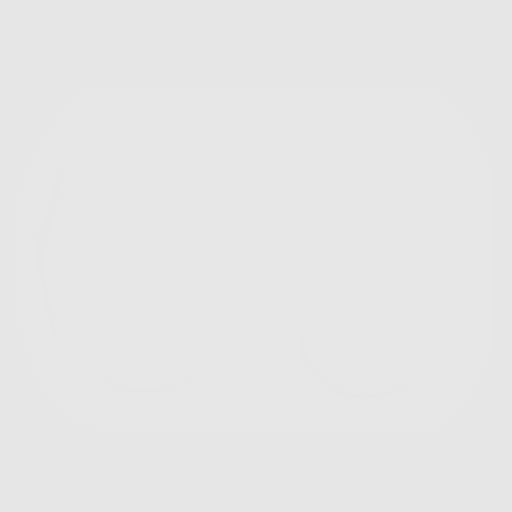
[frame 74/332  lung]
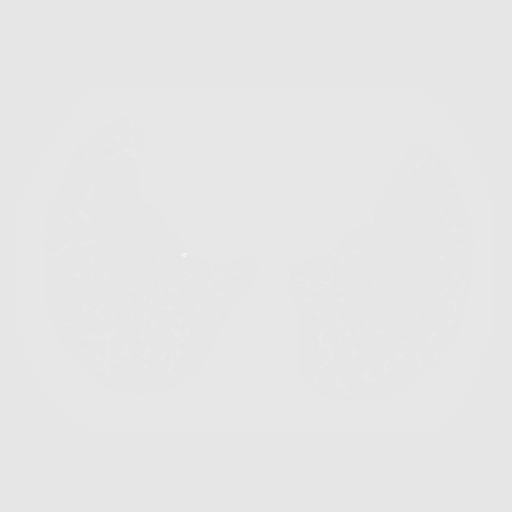
[frame 111/332  lung]
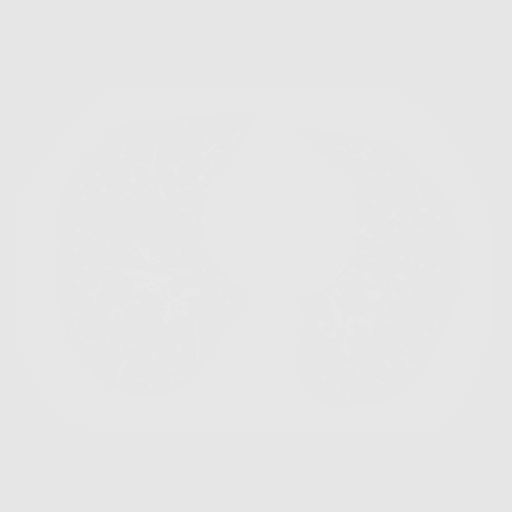
[frame 148/332  mediastinal]
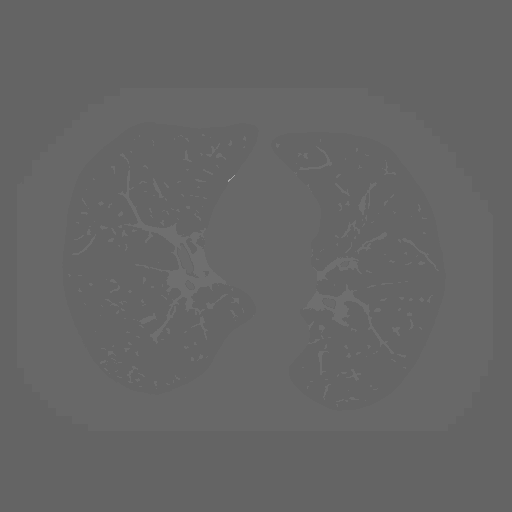
[frame 148/332  lung]
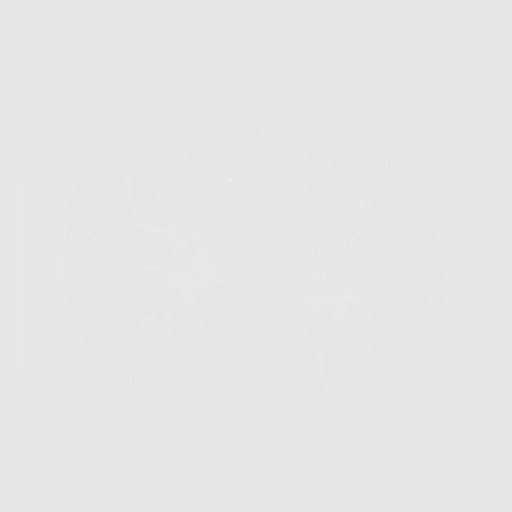
[frame 184/332  lung]
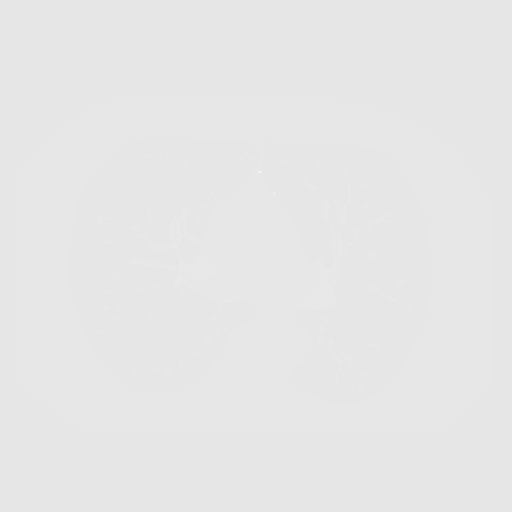
[frame 221/332  lung]
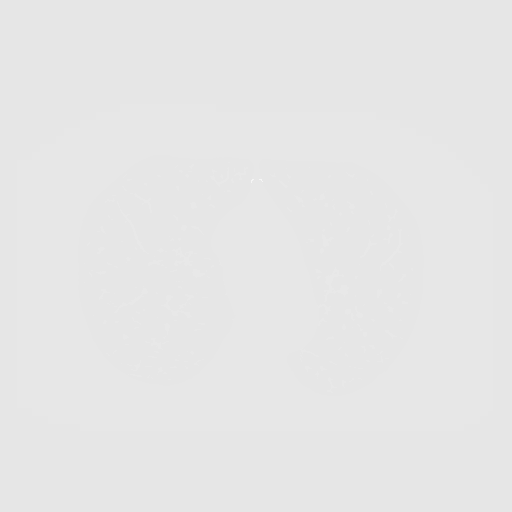
[frame 258/332  lung]
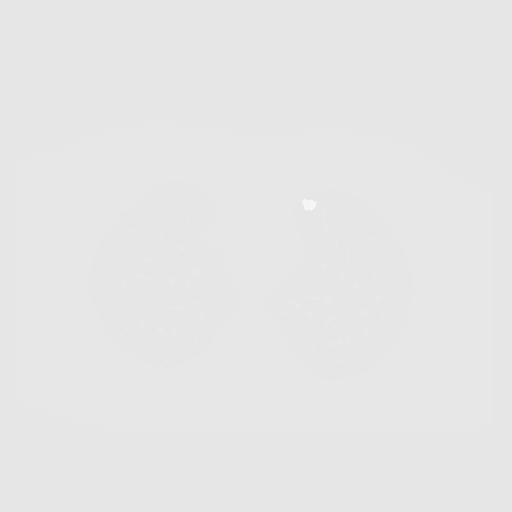
[frame 295/332  mediastinal]
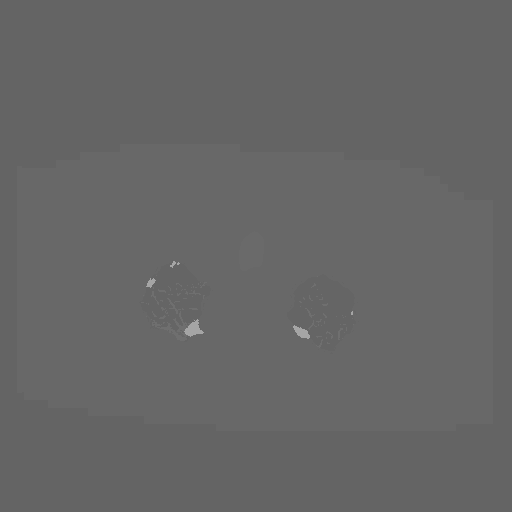
[frame 295/332  lung]
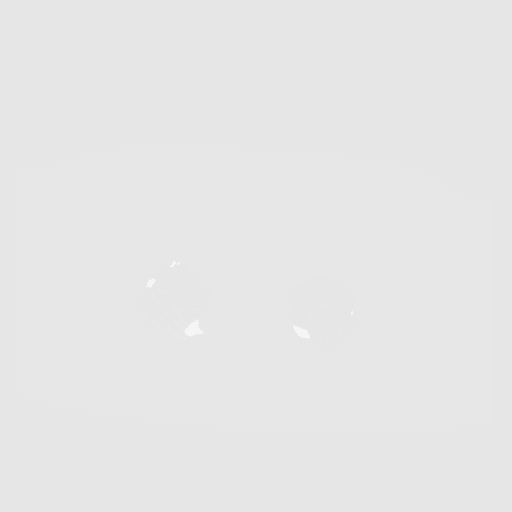
[frame 332/332  lung]
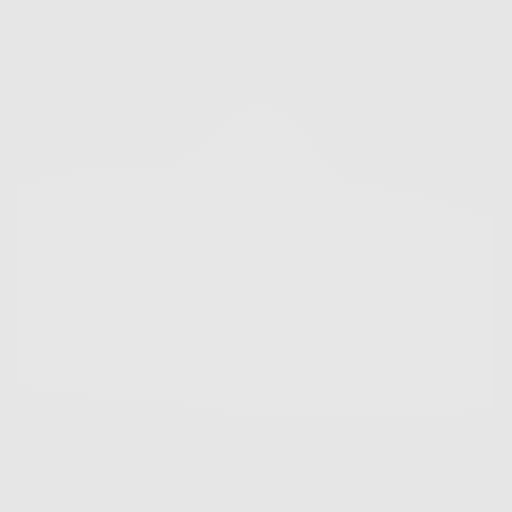

[10 of 10 positions shown; findings below may reference images not displayed]

FINDINGS: Cardiovascular: The heart size is normal. No substantial pericardial
effusion. Coronary artery calcification is evident. Mild
atherosclerotic calcification is noted in the wall of the thoracic
aorta.

Mediastinum/Nodes: No mediastinal lymphadenopathy. No evidence for
gross hilar lymphadenopathy although assessment is limited by the
lack of intravenous contrast on today's study. The esophagus has
normal imaging features. There is no axillary lymphadenopathy.

Lungs/Pleura: Centrilobular and paraseptal emphysema evident. Stable
appearance biapical pleuroparenchymal scarring. Tiny pulmonary
nodules identified on the previous study are stable in the interval.
No new suspicious pulmonary nodule or mass. No focal airspace
consolidation. No pleural effusion.

Upper Abdomen: Unremarkable.

Musculoskeletal: No worrisome lytic or sclerotic osseous
abnormality.
IMPRESSION: 1. Lung-RADS 2, benign appearance or behavior. Continue annual
screening with low-dose chest CT without contrast in 12 months.
2.  Emphysema (NOSEE-ZMT.G) and Aortic Atherosclerosis (NOSEE-170.0)

## 2021-12-04 ENCOUNTER — Ambulatory Visit (INDEPENDENT_AMBULATORY_CARE_PROVIDER_SITE_OTHER): Payer: Medicare Other | Admitting: Gastroenterology

## 2022-01-03 DIAGNOSIS — K1370 Unspecified lesions of oral mucosa: Secondary | ICD-10-CM | POA: Diagnosis not present

## 2022-01-03 DIAGNOSIS — M25512 Pain in left shoulder: Secondary | ICD-10-CM | POA: Diagnosis not present

## 2022-01-03 DIAGNOSIS — Z6828 Body mass index (BMI) 28.0-28.9, adult: Secondary | ICD-10-CM | POA: Diagnosis not present

## 2022-01-03 DIAGNOSIS — M19012 Primary osteoarthritis, left shoulder: Secondary | ICD-10-CM | POA: Diagnosis not present

## 2022-04-24 DIAGNOSIS — E782 Mixed hyperlipidemia: Secondary | ICD-10-CM | POA: Diagnosis not present

## 2022-04-24 DIAGNOSIS — R931 Abnormal findings on diagnostic imaging of heart and coronary circulation: Secondary | ICD-10-CM | POA: Diagnosis not present

## 2022-04-25 LAB — HEPATIC FUNCTION PANEL
ALT: 26 IU/L (ref 0–44)
AST: 34 IU/L (ref 0–40)
Albumin: 4.3 g/dL (ref 3.9–4.9)
Alkaline Phosphatase: 65 IU/L (ref 44–121)
Bilirubin Total: 0.4 mg/dL (ref 0.0–1.2)
Bilirubin, Direct: 0.14 mg/dL (ref 0.00–0.40)
Total Protein: 6.7 g/dL (ref 6.0–8.5)

## 2022-04-25 LAB — LIPID PANEL
Chol/HDL Ratio: 2.6 ratio (ref 0.0–5.0)
Cholesterol, Total: 124 mg/dL (ref 100–199)
HDL: 47 mg/dL (ref >39)
LDL Chol Calc (NIH): 59 mg/dL (ref 0–99)
Triglycerides: 98 mg/dL (ref 0–149)
VLDL Cholesterol Cal: 18 mg/dL (ref 5–40)

## 2022-06-13 ENCOUNTER — Other Ambulatory Visit (HOSPITAL_COMMUNITY): Payer: Self-pay | Admitting: Family Medicine

## 2022-06-13 DIAGNOSIS — E039 Hypothyroidism, unspecified: Secondary | ICD-10-CM | POA: Diagnosis not present

## 2022-06-13 DIAGNOSIS — I10 Essential (primary) hypertension: Secondary | ICD-10-CM | POA: Diagnosis not present

## 2022-06-13 DIAGNOSIS — E785 Hyperlipidemia, unspecified: Secondary | ICD-10-CM | POA: Diagnosis not present

## 2022-06-13 DIAGNOSIS — Z87891 Personal history of nicotine dependence: Secondary | ICD-10-CM

## 2022-06-13 DIAGNOSIS — Z0001 Encounter for general adult medical examination with abnormal findings: Secondary | ICD-10-CM | POA: Diagnosis not present

## 2022-06-13 DIAGNOSIS — I2584 Coronary atherosclerosis due to calcified coronary lesion: Secondary | ICD-10-CM | POA: Diagnosis not present

## 2022-06-13 DIAGNOSIS — I701 Atherosclerosis of renal artery: Secondary | ICD-10-CM | POA: Diagnosis not present

## 2022-07-16 ENCOUNTER — Ambulatory Visit (HOSPITAL_COMMUNITY)
Admission: RE | Admit: 2022-07-16 | Discharge: 2022-07-16 | Disposition: A | Discharge status: Home / Self Care | Payer: Medicare Other | Source: Ambulatory Visit | Attending: Family Medicine | Admitting: Family Medicine

## 2022-07-16 DIAGNOSIS — Z0001 Encounter for general adult medical examination with abnormal findings: Secondary | ICD-10-CM | POA: Insufficient documentation

## 2022-07-16 DIAGNOSIS — Z87891 Personal history of nicotine dependence: Secondary | ICD-10-CM | POA: Diagnosis not present

## 2022-07-20 ENCOUNTER — Encounter: Payer: Self-pay | Admitting: Adult Health

## 2022-07-20 ENCOUNTER — Ambulatory Visit: Payer: Medicare Other | Attending: Adult Health | Admitting: Adult Health

## 2022-07-20 VITALS — BP 104/76 | HR 64 | Ht 71.0 in | Wt 206.4 lb

## 2022-07-20 DIAGNOSIS — Z720 Tobacco use: Secondary | ICD-10-CM | POA: Diagnosis not present

## 2022-07-20 DIAGNOSIS — I10 Essential (primary) hypertension: Secondary | ICD-10-CM | POA: Diagnosis not present

## 2022-07-20 DIAGNOSIS — E7800 Pure hypercholesterolemia, unspecified: Secondary | ICD-10-CM

## 2022-07-20 DIAGNOSIS — I701 Atherosclerosis of renal artery: Secondary | ICD-10-CM | POA: Diagnosis not present

## 2022-07-20 NOTE — Patient Instructions (Signed)
Medication Instructions:  No Changes *If you need a refill on your cardiac medications before your next appointment, please call your pharmacy*   Lab Work: No Labs If you have labs (blood work) drawn today and your tests are completely normal, you will receive your results only by: [place] (if you have MyChart) OR A paper copy in the mail If you have any lab test that is abnormal or we need to change your treatment, we will call you to review the results.   Testing/Procedures: No Testing   Follow-Up: At [facility], you and your health needs are our priority.  As part of our continuing mission to provide you with exceptional heart care, we have created designated Provider Care Teams.  These Care Teams include your primary Cardiologist (physician) and Advanced Practice Providers (APPs -  Physician Assistants and Nurse Practitioners) who all work together to provide you with the care you need, when you need it.  We recommend signing up for the patient portal called "MyChart".  Sign up information is provided on this After Visit Summary.  MyChart is used to connect with patients for Virtual Visits (Telemedicine).  Patients are able to view lab/test results, encounter notes, upcoming appointments, etc.  Non-urgent messages can be sent to your provider as well.   To learn more about what you can do with MyChart, go to [email or web address].    Your next appointment:   1 year(s)  The format for your next appointment:   In Person  Provider:   Quay Burow, MD  or Jory Sims, DNP, ANP

## 2022-07-20 NOTE — Progress Notes (Signed)
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 Last seen by Dr. Gwenlyn Found on 11/14/2021 at which time rosuvastatin was increased to 20 mg daily, unable to tolerate Bystolic due to symptomatic bradycardia.   Past Medical History    Past Medical History:  Diagnosis Date   Anxiety    Bilateral renal artery stenosis ([place])    History of tobacco abuse    Hyperlipidemia    Hypertension    Hypothyroidism    Palpitations    Thyroid disease    Past Surgical History:  Procedure Laterality Date   BIOPSY  08/29/2021   Procedure: BIOPSY;  Surgeon: Harvel Quale, MD;  Location: [facility];  Service: Gastroenterology;;   COLONOSCOPY N/A 07/11/2016   Procedure: COLONOSCOPY;  Surgeon: Rogene Houston, MD;  Location: [facility];  Service: Endoscopy;  Laterality: N/A;  930   COLONOSCOPY WITH PROPOFOL N/A 08/29/2021   Procedure: COLONOSCOPY WITH PROPOFOL;  Surgeon: Harvel Quale, MD;  Location: [facility];  Service: Gastroenterology;  Laterality: N/A;  1200   EYE SURGERY     MENISCUS REPAIR Left 05/29/13   POLYPECTOMY  07/11/2016   Procedure: POLYPECTOMY;  Surgeon: Rogene Houston, MD;  Location: [facility];  Service: Endoscopy;;  colon   POLYPECTOMY  08/29/2021   Procedure: POLYPECTOMY;  Surgeon: Harvel Quale, MD;  Location: [facility];  Service: Gastroenterology;;   US ECHOCARDIOGRAPHY  07/18/2012    Allergies  No Known Allergies  History of Present Illness    Mr. Roessner presents today for ongoing assessment and management of hypertension, hyperlipidemia,  frequent PVCs, and nonobstructive coronary artery disease.   He is without any cardiac complaints today.  He is medically compliant.,  Is trying to quit smoking, using nicotine patches.  He remains active, he is having his labs followed by his primary care provider which were completed in August 2023.  Home Medications    Current Outpatient Medications  Medication Sig Dispense Refill   escitalopram (LEXAPRO) 10 MG tablet Take 10 mg by mouth at bedtime.      levothyroxine (SYNTHROID, LEVOTHROID) 125 MCG tablet Take 62.5-125 mcg by mouth See admin instructions. Take 125 mcg by mouth Monday-Friday and take 62.5 mcg on Saturday and Sunday.     nebivolol (BYSTOLIC) 5 MG tablet Take 1 tablet (5 mg total) by mouth every morning. (Patient taking differently: Take 2.5 mg by mouth every morning.) 30 tablet 6   pantoprazole (PROTONIX) 40 MG tablet Take 40 mg by mouth daily.     rosuvastatin (CRESTOR) 20 MG tablet Take 1 tablet (10 mg) by mouth daily 90 tablet 3   No current facility-administered medications for this visit.     Family History    Family History  Problem Relation Age of Onset   Hyperlipidemia Father    Hypertension Father    Stroke Father    CAD Maternal Grandmother    Heart attack Maternal Grandfather    Cancer Mother    Diabetes Mother    Hypertension Mother    He indicated that the status of his mother is unknown. He indicated that his  father is deceased. He indicated that his sister is alive. He indicated that his maternal grandmother is deceased. He indicated that his maternal grandfather is deceased. He indicated that his paternal grandmother is deceased. He indicated that his paternal grandfather is deceased.  Social History    Social History   Socioeconomic History   Marital status: Married    Spouse name: Not on file   Number of children: Not on file   Years of education: Not on file   Highest education level: Not on file  Occupational History   Not on file   Tobacco Use   Smoking status: Former    Packs/day: 2.00    Years: 40.00    Total pack years: 80.00    Types: Cigarettes    Quit date: 12/16/2010    Years since quitting: 11.6   Smokeless tobacco: Current    Types: Snuff   Tobacco comments:    snus  Vaping Use   Vaping Use: Never used  Substance and Sexual Activity   Alcohol use: Yes    Alcohol/week: 28.0 standard drinks of alcohol    Types: 28 Standard drinks or equivalent per week    Comment: beer 2 -5 per day- none for last few weeks as of 08/24/2021   Drug use: No   Sexual activity: Yes  Other Topics Concern   Not on file  Social History Narrative   Not on file   Social Determinants of Health   Financial Resource Strain: Not on file  Food Insecurity: Not on file  Transportation Needs: Not on file  Physical Activity: Not on file  Stress: Not on file  Social Connections: Not on file  Intimate Partner Violence: Not on file     Review of Systems    General:  No chills, fever, night sweats or weight changes.  Cardiovascular:  No chest pain, dyspnea on exertion, edema, orthopnea, palpitations, paroxysmal nocturnal dyspnea. Dermatological: No rash, lesions/masses Respiratory: No cough, dyspnea Urologic: No hematuria, dysuria Abdominal:   No nausea, vomiting, diarrhea, bright red blood per rectum, melena, or hematemesis Neurologic:  No visual changes, wkns, changes in mental status. All other systems reviewed and are otherwise negative except as noted above.     Physical Exam    VS:  BP 104/76 (BP Location: Left Arm, Patient Position: Sitting)   Pulse 64   Ht '5\' 11"'$  (1.803 m)   Wt 206 lb 6.4 oz (93.6 kg)   SpO2 96%   BMI 28.79 kg/m  , BMI Body mass index is 28.79 kg/m.     GEN: Well nourished, well developed, in no acute distress. HEENT: normal. Neck: Supple, no JVD, carotid bruits, or masses. Cardiac: Distant heart sounds, RRR, no murmurs, rubs, or gallops. No clubbing, cyanosis, edema.  Radials/DP/PT 2+  and equal bilaterally.  Respiratory:  Respirations regular and unlabored, clear to auscultation bilaterally. GI: Soft, nontender, nondistended, BS + x 4. MS: no deformity or atrophy. Skin: warm and dry, no rash. Neuro:  Strength and sensation are intact. Psych: Normal affect.  Accessory Clinical Findings    ECG personally reviewed by me today-not completed this office visit.  Lab Results  Component Value Date   WBC 5.4 06/03/2019   HGB 13.9 06/03/2019   HCT 41.0 06/03/2019   MCV 93 06/03/2019   PLT 244 06/03/2019   Lab Results  Component Value Date   CREATININE 0.96 06/03/2019   BUN 15 06/03/2019   NA 140 06/03/2019   K 4.3 06/03/2019  CL 98 06/03/2019   CO2 27 06/03/2019   Lab Results  Component Value Date   ALT 26 04/24/2022   AST 34 04/24/2022   ALKPHOS 65 04/24/2022   BILITOT 0.4 04/24/2022   Lab Results  Component Value Date   CHOL 124 04/24/2022   HDL 47 04/24/2022   LDLCALC 59 04/24/2022   LDLDIRECT 114 (H) 08/19/2019   TRIG 98 04/24/2022   CHOLHDL 2.6 04/24/2022    No results found for: "HGBA1C"  Review of Prior Studies:  Echocardiogram 06/05/2021 1. Left ventricular ejection fraction, by estimation, is 55%. The left  ventricle has normal function. The left ventricle has no regional wall  motion abnormalities. Left ventricular diastolic parameters were normal.  The average left ventricular global  longitudinal strain is -26.5 %. The global longitudinal strain is normal.   2. Right ventricular systolic function is normal. The right ventricular  size is normal. Tricuspid regurgitation signal is inadequate for assessing  PA pressure.   3. The mitral valve is normal in structure. Trivial mitral valve  regurgitation. No evidence of mitral stenosis.   4. The aortic valve is tricuspid. Aortic valve regurgitation is not  visualized. No aortic stenosis is present.   5. Aortic dilatation noted. There is borderline dilatation of the aortic  root, measuring  37 mm.   6. The inferior vena cava is normal in size with greater than 50%  respiratory variability, suggesting right atrial pressure of 3 mmHg.    Renal Ultrasound 08/22/2020   Right: Normal size right kidney. Normal right Resistive Index.         Normal cortical thickness of right kidney. 1-59% stenosis of         the right renal artery. RRV flow present. Velocities in the         right renal artery are stable when compared to the prior         exam.  Left:  Normal size of left kidney. Normal left Resistive Index.         Normal cortical thickness of the left kidney. 1-59% stenosis         of the left renal artery. LRV flow present. Velocities in the         left renal artery have decreased when compared to the prior         exam.  Mesenteric: Normal Superior Mesenteric artery findings. 70 to 99% stenosis in the  celiac      artery. Patent IVC.   Coronary CTA 07/09/2019 FINDINGS: There was no evidence for hemodynamically significant stenosis, all FFR > 0.8.   IMPRESSION: No evidence for hemodynamically significant stenosis. Assessment & Plan   1.  Hypertension: Low normal today, denies any associated dizziness or fatigue.  Prior blood pressures were within normal range.  He denies issues with medication compliance.  He asks if he can take his antihypertensive at night instead of in the morning as he often forgets to take it at that time.  I have told him this would be acceptable just so he will take it each day at the same time every day.  2.  Hypercholesterolemia: Most recent labs show very good control of cholesterol on rosuvastatin from August 2023.  LFTs were normal.  No changes in his regimen at this time.  3.  PAD: History of celiac disease.  No evidence of mesenteric artery disease or femoral artery disease.  Continue blood pressure control and lipid management.  4.  Tobacco abuse: Patient  is trying to quit especially having had chest CT.  It did show some signs of  emphysema but not severe.  It did show some coronary artery atherosclerosis, but CT scan in 2020 did not reveal any CAD.  Encouraged him to continue his efforts on smoking cessation  Current medicines are reviewed at length with the patient today.  I have spent 25 min's  dedicated to the care of this patient on the date of this encounter to include pre-visit review of records, assessment, management and diagnostic testing,with shared decision making. Signed, Phill Myron. West Pugh, ANP, AACC   07/20/2022 9:11 AM      Office [phone] Fax [phone]  Notice: This dictation was prepared with Dragon dictation along with smaller phrase technology. Any transcriptional errors that result from this process are unintentional and may not be corrected upon review.

## 2022-12-12 DIAGNOSIS — H52223 Regular astigmatism, bilateral: Secondary | ICD-10-CM | POA: Diagnosis not present

## 2023-01-21 ENCOUNTER — Telehealth: Payer: Self-pay | Admitting: Cardiovascular Disease

## 2023-01-21 NOTE — Telephone Encounter (Signed)
Patient wants to know if he will need to do a renal test prior to his next office visit.

## 2023-01-21 NOTE — Telephone Encounter (Signed)
Call patient, does not ring.  Goes to VM.  LM to call office

## 2023-01-22 NOTE — Telephone Encounter (Signed)
Patient received letter about appt.  He called to schedule but schedule was not open  Advised to call back end of June if he has not heard from [place] by then

## 2023-04-02 DIAGNOSIS — F3342 Major depressive disorder, recurrent, in full remission: Secondary | ICD-10-CM | POA: Diagnosis not present

## 2023-04-10 DIAGNOSIS — K080 Exfoliation of teeth due to systemic causes: Secondary | ICD-10-CM | POA: Diagnosis not present

## 2023-04-22 DIAGNOSIS — U071 COVID-19: Secondary | ICD-10-CM | POA: Diagnosis not present

## 2023-04-22 DIAGNOSIS — E663 Overweight: Secondary | ICD-10-CM | POA: Diagnosis not present

## 2023-04-22 DIAGNOSIS — Z6829 Body mass index (BMI) 29.0-29.9, adult: Secondary | ICD-10-CM | POA: Diagnosis not present

## 2023-05-28 ENCOUNTER — Telehealth: Payer: Self-pay | Admitting: Cardiovascular Disease

## 2023-05-28 NOTE — Telephone Encounter (Signed)
Patient c/o Palpitations:  High priority if patient c/o lightheadedness, shortness of breath, or chest pain  How long have you had palpitations/irregular HR/ Afib? Are you having the symptoms now? A month ago it started after covid, and yes, having symptoms now.   Are you currently experiencing lightheadedness, SOB or CP? Lightheadedness at times.   Do you have a history of afib (atrial fibrillation) or irregular heart rhythm? Yes   Have you checked your BP or HR? (document readings if available): No   Are you experiencing any other symptoms? No

## 2023-05-28 NOTE — Telephone Encounter (Signed)
Returned pt call in regards to his Afib. Phone rang busy.

## 2023-05-29 NOTE — Telephone Encounter (Signed)
Patient states Afib since Covid approx 2 months ago.  He states some episodes prior to Covid, but seems more often since illness. Some episodes last a few minutes, other episodes last 10 minutes. 2-3 episodes a day. Unsure of HR, states not fast unless doing something strenuous. The episodes are random any where from sitting to walking around.  No chest pain, just occasional lightheadedness. Increased fatigue but this has also been since covid.  Legs "feel tired all the time".  He had appt for November and moved up to September next week.

## 2023-06-02 NOTE — Progress Notes (Unsigned)
Cardiology Clinic Note   Patient Name: Daniel [name] Date of Encounter: 06/04/2023  Primary Care Provider:  Assunta Found, MD Primary Cardiologist:  Nanetta Batty, MD  Patient Profile    67 year old male with history of Hypertension, HL, PVC's and non-obstructive CAD per cardiac CTA on 10/22/20221.Renal artery ultrasound 08/22/2020 with decrease velocities in the left renal artery and 70 to 99% stenosis in the celiac artery, tobacco abuse.   Past Medical History    Past Medical History:  Diagnosis Date   Anxiety    Bilateral renal artery stenosis (HCC)    History of tobacco abuse    Hyperlipidemia    Hypertension    Hypothyroidism    Palpitations    Thyroid disease    Past Surgical History:  Procedure Laterality Date   BIOPSY  08/29/2021   Procedure: BIOPSY;  Surgeon: Dolores Frame, MD;  Location: [facility];  Service: Gastroenterology;;   COLONOSCOPY N/A 07/11/2016   Procedure: COLONOSCOPY;  Surgeon: Malissa Hippo, MD;  Location: [facility];  Service: Endoscopy;  Laterality: N/A;  930   COLONOSCOPY WITH PROPOFOL N/A 08/29/2021   Procedure: COLONOSCOPY WITH PROPOFOL;  Surgeon: Dolores Frame, MD;  Location: [facility];  Service: Gastroenterology;  Laterality: N/A;  1200   EYE SURGERY     MENISCUS REPAIR Left 05/29/13   POLYPECTOMY  07/11/2016   Procedure: POLYPECTOMY;  Surgeon: Malissa Hippo, MD;  Location: [facility];  Service: Endoscopy;;  colon   POLYPECTOMY  08/29/2021   Procedure: POLYPECTOMY;  Surgeon: Dolores Frame, MD;  Location: [facility];  Service: Gastroenterology;;   US ECHOCARDIOGRAPHY  07/18/2012    Allergies  No Known Allergies  History of Present Illness    Daniel [name] comes today with complaints of recurrent palpitations which have worsened over the last several months.  He has a history of palpitations and did have a ZIO monitor placed approximately 2 years ago which did reveal runs of PSVT.   He has been on Bystolic 2.5 mg daily for this.  He is stating now that the palpitations are not being controlled on the current dose of Bystolic, and he is noticing it a lot more specially in the morning.  He denies chest pain but he does feel pressure in his chest when he feels them come on and last for several minutes.   Home Medications    Current Outpatient Medications  Medication Sig Dispense Refill   escitalopram (LEXAPRO) 10 MG tablet Take 10 mg by mouth at bedtime.      levothyroxine (SYNTHROID, LEVOTHROID) 125 MCG tablet Take 62.5-125 mcg by mouth See admin instructions. Take 125 mcg by mouth Monday-Friday and take 62.5 mcg on Saturday and Sunday.     rosuvastatin (CRESTOR) 20 MG tablet Take 1 tablet (10 mg) by mouth daily 90 tablet 3   nebivolol (BYSTOLIC) 5 MG tablet Take 1 tablet (5 mg total) by mouth every morning. 90 tablet 2   pantoprazole (PROTONIX) 40 MG tablet Take 40 mg by mouth daily.     No current facility-administered medications for this visit.     Family History    Family History  Problem Relation Age of Onset   Hyperlipidemia Father    Hypertension Father    Stroke Father    CAD Maternal Grandmother    Heart attack Maternal Grandfather    Cancer Mother    Diabetes Mother    Hypertension Mother    He indicated that  the status of his mother is unknown. He indicated that his father is deceased. He indicated that his sister is alive. He indicated that his maternal grandmother is deceased. He indicated that his maternal grandfather is deceased. He indicated that his paternal grandmother is deceased. He indicated that his paternal grandfather is deceased.  Social History    Social History   Socioeconomic History   Marital status: Married    Spouse name: Not on file   Number of children: Not on file   Years of education: Not on file   Highest education level: Not on file  Occupational History   Not on file  Tobacco Use   Smoking status: Former    Current  packs/day: 0.00    Average packs/day: 2.0 packs/day for 40.0 years (80.0 ttl pk-yrs)    Types: Cigarettes    Start date: 12/16/1970    Quit date: 12/16/2010    Years since quitting: 12.4   Smokeless tobacco: Current    Types: Snuff   Tobacco comments:    snus  Vaping Use   Vaping status: Never Used  Substance and Sexual Activity   Alcohol use: Yes    Alcohol/week: 28.0 standard drinks of alcohol    Types: 28 Standard drinks or equivalent per week    Comment: beer 2 -5 per day- none for last few weeks as of 08/24/2021   Drug use: No   Sexual activity: Yes  Other Topics Concern   Not on file  Social History Narrative   Not on file   Social Determinants of Health   Financial Resource Strain: Not on file  Food Insecurity: Not on file  Transportation Needs: Not on file  Physical Activity: Not on file  Stress: Not on file  Social Connections: Not on file  Intimate Partner Violence: Not on file     Review of Systems    General:  No chills, fever, night sweats or weight changes.  Cardiovascular:  No chest pain, dyspnea on exertion, edema, orthopnea, positive for increasing palpitations, paroxysmal nocturnal dyspnea. Dermatological: No rash, lesions/masses Respiratory: No cough, dyspnea Urologic: No hematuria, dysuria Abdominal:   No nausea, vomiting, diarrhea, bright red blood per rectum, melena, or hematemesis Neurologic:  No visual changes, wkns, changes in mental status. All other systems reviewed and are otherwise negative except as noted above.       Physical Exam    VS:  BP 138/78   Pulse 63   Ht 5\' 11"  (1.803 m)   Wt 212 lb (96.2 kg)   SpO2 96%   BMI 29.57 kg/m  , BMI Body mass index is 29.57 kg/m.     GEN: Well nourished, well developed, in no acute distress. HEENT: normal. Neck: Supple, no JVD, carotid bruits, or masses. Cardiac: RRR, no murmurs, rubs, or gallops. No clubbing, cyanosis, edema.  Radials/DP/PT 2+ and equal bilaterally.  Respiratory:   Respirations regular and unlabored, clear to auscultation bilaterally. GI: Soft, nontender, nondistended, BS + x 4. MS: no deformity or atrophy. Skin: warm and dry, no rash. Neuro:  Strength and sensation are intact. Psych: Normal affect.      Lab Results  Component Value Date   WBC 5.4 06/03/2019   HGB 13.9 06/03/2019   HCT 41.0 06/03/2019   MCV 93 06/03/2019   PLT 244 06/03/2019   Lab Results  Component Value Date   CREATININE 0.96 06/03/2019   BUN 15 06/03/2019   NA 140 06/03/2019   K 4.3 06/03/2019   CL  98 06/03/2019   CO2 27 06/03/2019   Lab Results  Component Value Date   ALT 26 04/24/2022   AST 34 04/24/2022   ALKPHOS 65 04/24/2022   BILITOT 0.4 04/24/2022   Lab Results  Component Value Date   CHOL 124 04/24/2022   HDL 47 04/24/2022   LDLCALC 59 04/24/2022   LDLDIRECT 114 (H) 08/19/2019   TRIG 98 04/24/2022   CHOLHDL 2.6 04/24/2022    No results found for: "HGBA1C"   Review of Prior Studies    Zio Monitor 06/05/2021 Patient had a min HR of 40 bpm, max HR of 197 bpm, and avg HR of 69 bpm. Predominant underlying rhythm was Sinus Rhythm. 3 Supraventricular Tachycardia runs occurred, the run with the fastest interval lasting 11.2 secs with a max rate of 197 bpm (avg 176  bpm); the run with the fastest interval was also the longest. True duration of Supraventricular Tachycardia difficult to ascertain due to artifact. Isolated SVEs were rare (<1.0%), SVE Couplets were rare (<1.0%), and SVE Triplets were rare (<1.0%). Isolated VEs were rare (<1.0%), and no VE Couplets or VE Triplets were present.   1. SR/SB/ST 2. Occasional PACs nd PVCs 3. Runs of SVT with rates up to 200 BPM  Echocardiogram 06/05/2021 1. Left ventricular ejection fraction, by estimation, is 55%. The left  ventricle has normal function. The left ventricle has no regional wall  motion abnormalities. Left ventricular diastolic parameters were normal.  The average left ventricular global   longitudinal strain is -26.5 %. The global longitudinal strain is normal.   2. Right ventricular systolic function is normal. The right ventricular  size is normal. Tricuspid regurgitation signal is inadequate for assessing  PA pressure.   3. The mitral valve is normal in structure. Trivial mitral valve  regurgitation. No evidence of mitral stenosis.   4. The aortic valve is tricuspid. Aortic valve regurgitation is not  visualized. No aortic stenosis is present.   5. Aortic dilatation noted. There is borderline dilatation of the aortic  root, measuring 37 mm.   6. The inferior vena cava is normal in size with greater than 50%  respiratory variability, suggesting right atrial pressure of 3 mmHg.   Renal Artery Doppler Study 08/22/2020 Summary:  Largest Aortic Diameter: 2.4 cm    Renal:    Right: Normal size right kidney. Normal right Resistive Index.         Normal cortical thickness of right kidney. 1-59% stenosis of         the right renal artery. RRV flow present. Velocities in the         right renal artery are stable when compared to the prior         exam.  Left:  Normal size of left kidney. Normal left Resistive Index.         Normal cortical thickness of the left kidney. 1-59% stenosis         of the left renal artery. LRV flow present. Velocities in the         left renal artery have decreased when compared to the prior         exam.  Mesenteric:  Normal Superior Mesenteric artery findings. 70 to 99% stenosis in the  celiac  artery.      Assessment & Plan   1.  Palpitations: The patient complains of increasing palpitations especially over the last several months.  He feels chest pressure when he has several bouts of them throughout  the day or when they last for a long time.  He is on Bystolic 2.5 mg daily.  I will increase it to 5 mg daily to see if this will help to suppress them.  He will let us know how he feels as before he states he felt his heart rate was too slow  on the higher dose.  Will check BMET, magnesium.  2.  Hypothyroidism: He has not had labs ordered in over a year.  Will check a TSH with her blood draw to see if this is playing into worsening palpitations.  3.  Hypercholesterolemia: Followed by PCP.  Remains on rosuvastatin 10 mg daily.  4.  PAD: History of decreased velocities in the left renal artery and stenosis in the celiac artery.  No intervention currently.  Will need follow-up renal artery ultrasound on next office visit.         Signed, Bettey Mare. Liborio Nixon, ANP, AACC   06/04/2023 4:05 PM      Office [phone] Fax [phone]  Notice: This dictation was prepared with Dragon dictation along with smaller phrase technology. Any transcriptional errors that result from this process are unintentional and may not be corrected upon review.

## 2023-06-04 ENCOUNTER — Ambulatory Visit: Payer: Medicare Other | Attending: Adult Health | Admitting: Adult Health

## 2023-06-04 ENCOUNTER — Encounter: Payer: Self-pay | Admitting: Adult Health

## 2023-06-04 VITALS — BP 138/78 | HR 63 | Ht 71.0 in | Wt 212.0 lb

## 2023-06-04 DIAGNOSIS — I771 Stricture of artery: Secondary | ICD-10-CM | POA: Diagnosis not present

## 2023-06-04 DIAGNOSIS — I701 Atherosclerosis of renal artery: Secondary | ICD-10-CM

## 2023-06-04 DIAGNOSIS — R002 Palpitations: Secondary | ICD-10-CM

## 2023-06-04 DIAGNOSIS — I10 Essential (primary) hypertension: Secondary | ICD-10-CM

## 2023-06-04 MED ORDER — NEBIVOLOL HCL 5 MG PO TABS: 5.0000 mg | ORAL_TABLET | Freq: Every morning | ORAL | 2 refills | Status: DC

## 2023-06-04 NOTE — Patient Instructions (Signed)
Medication Instructions:  Increase Bystolic to 5mg  ( Take 1 Tablet Daily). *If you need a refill on your cardiac medications before your next appointment, please call your pharmacy*   Lab Work: TSH, MET, Magnesium. If you have labs (blood work) drawn today and your tests are completely normal, you will receive your results only by: MyChart Message (if you have MyChart) OR A paper copy in the mail If you have any lab test that is abnormal or we need to change your treatment, we will call you to review the results.   Testing/Procedures: No Testing   Follow-Up: At [facility], you and your health needs are our priority.  As part of our continuing mission to provide you with exceptional heart care, we have created designated Provider Care Teams.  These Care Teams include your primary Cardiologist (physician) and Advanced Practice Providers (APPs -  Physician Assistants and Nurse Practitioners) who all work together to provide you with the care you need, when you need it.  We recommend signing up for the patient portal called "MyChart".  Sign up information is provided on this After Visit Summary.  MyChart is used to connect with patients for Virtual Visits (Telemedicine).  Patients are able to view lab/test results, encounter notes, upcoming appointments, etc.  Non-urgent messages can be sent to your provider as well.   To learn more about what you can do with MyChart, go to [email or web address].    Your next appointment:   1 month(s)  Provider:   [name], DNP, ANP

## 2023-06-07 ENCOUNTER — Telehealth: Payer: Self-pay

## 2023-06-07 NOTE — Telephone Encounter (Addendum)
Called patient regarding results. Unable to leave message due to mailbox not set up. Letter mailed 06/07/23.----- Message from [name] sent at 06/06/2023  7:19 AM EDT ----- TSH was abnormal. He needs to see PCP for management. This may be why he is having so many palpitations.

## 2023-06-10 DIAGNOSIS — E6609 Other obesity due to excess calories: Secondary | ICD-10-CM | POA: Diagnosis not present

## 2023-06-10 DIAGNOSIS — I10 Essential (primary) hypertension: Secondary | ICD-10-CM | POA: Diagnosis not present

## 2023-06-10 DIAGNOSIS — E039 Hypothyroidism, unspecified: Secondary | ICD-10-CM | POA: Diagnosis not present

## 2023-06-10 DIAGNOSIS — Z23 Encounter for immunization: Secondary | ICD-10-CM | POA: Diagnosis not present

## 2023-06-10 DIAGNOSIS — Z6830 Body mass index (BMI) 30.0-30.9, adult: Secondary | ICD-10-CM | POA: Diagnosis not present

## 2023-06-10 DIAGNOSIS — R002 Palpitations: Secondary | ICD-10-CM | POA: Diagnosis not present

## 2023-06-10 DIAGNOSIS — I2584 Coronary atherosclerosis due to calcified coronary lesion: Secondary | ICD-10-CM | POA: Diagnosis not present

## 2023-06-27 DIAGNOSIS — Z1331 Encounter for screening for depression: Secondary | ICD-10-CM | POA: Diagnosis not present

## 2023-06-27 DIAGNOSIS — E785 Hyperlipidemia, unspecified: Secondary | ICD-10-CM | POA: Diagnosis not present

## 2023-06-27 DIAGNOSIS — Z6830 Body mass index (BMI) 30.0-30.9, adult: Secondary | ICD-10-CM | POA: Diagnosis not present

## 2023-06-27 DIAGNOSIS — E039 Hypothyroidism, unspecified: Secondary | ICD-10-CM | POA: Diagnosis not present

## 2023-06-27 DIAGNOSIS — E291 Testicular hypofunction: Secondary | ICD-10-CM | POA: Diagnosis not present

## 2023-06-27 DIAGNOSIS — R002 Palpitations: Secondary | ICD-10-CM | POA: Diagnosis not present

## 2023-06-27 DIAGNOSIS — Z0001 Encounter for general adult medical examination with abnormal findings: Secondary | ICD-10-CM | POA: Diagnosis not present

## 2023-06-27 DIAGNOSIS — I2584 Coronary atherosclerosis due to calcified coronary lesion: Secondary | ICD-10-CM | POA: Diagnosis not present

## 2023-06-27 DIAGNOSIS — I10 Essential (primary) hypertension: Secondary | ICD-10-CM | POA: Diagnosis not present

## 2023-06-27 DIAGNOSIS — E6609 Other obesity due to excess calories: Secondary | ICD-10-CM | POA: Diagnosis not present

## 2023-07-05 NOTE — Progress Notes (Unsigned)
Cardiology Office Note:  .   Date:  07/05/2023  ID:  AHMER WHETSTONE, DOB 08/06/1956, MRN [id number] PCP: Assunta Found, MD  [facility] Providers Cardiologist:  \Dr, Allyson Sabal  }   History of Present Illness: .   Daniel [name] is a 67 y.o. 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  On last seen the patient was complaining of frequent palpitations.  I increased his Bystolic from 2.5 mg daily to 5 mg daily.  TSH, and magnesium were also checked.  TSH was elevated at 5.9 and he was referred back to PCP for management of hypothyroidism. He requires a repeat renal artery ultrasound on this visit.   The patient states she is feeling much better on the higher dose of Bystolic.  The patient has been followed by endocrinology and adjustments of levothyroxine has been completed, along with testosterone injection.  He states he is feeling better and has less palpitations.  No associated chest pain, shortness of breath, or dizziness currently.   ROS: As above otherwise negative.  Studies Reviewed: .     Echocardiogram 06/05/2021   1. Left ventricular ejection fraction, by estimation, is 55%. The left  ventricle has normal function. The left ventricle has no regional wall  motion abnormalities. Left ventricular diastolic parameters were normal.  The average left ventricular global  longitudinal strain is -26.5 %. The global longitudinal strain is normal.   2. Right ventricular systolic function is normal. The right ventricular  size is normal. Tricuspid regurgitation signal is inadequate for assessing  PA pressure.   3. The mitral valve is normal in structure. Trivial mitral valve  regurgitation. No evidence of mitral stenosis.   4. The aortic valve is tricuspid. Aortic valve regurgitation is not  visualized. No aortic stenosis is present.   5.  Aortic dilatation noted. There is borderline dilatation of the aortic  root, measuring 37 mm.   6. The inferior vena cava is normal in size with greater than 50%  respiratory variability, suggesting right atrial pressure of 3 mmHg.    Physical Exam:   VS:  There were no vitals taken for this visit.   Wt Readings from Last 3 Encounters:  06/04/23 212 lb (96.2 kg)  07/20/22 206 lb 6.4 oz (93.6 kg)  11/14/21 207 lb 12.8 oz (94.3 kg)    GEN: Well nourished, well developed in no acute distress NECK: No JVD; No carotid bruits CARDIAC: RRR, no murmurs, rubs, gallops RESPIRATORY:  Clear to auscultation without rales, wheezing or rhonchi  ABDOMEN: Soft, non-tender, non-distended EXTREMITIES:  No edema; No deformity   ASSESSMENT AND PLAN: .    Renal artery stenosis: Patient is due for renal artery ultrasound follow-up.  This is ordered today.  He will follow-up with Dr. Allyson Sabal if abnormal.  Currently he is asymptomatic and blood pressure is well-controlled.  I am not hearing any bruits currently.  2.  Frequent palpitations: Improved with increased dose of Bystolic to 5 mg daily, as well as adjustment in levothyroxine per endocrinology.  He is feeling better and denies any new symptoms.  3.  Hypertension: Blood pressure is well-controlled currently on increased dose of Bystolic.  On no other antihypertensives currently.  4.  Hypercholesterolemia: Goal of LDL less than 70 in the setting of renal artery stenosis.Will need follow up lipid panel if not completed by PCP.  Signed, Bettey Mare. Liborio Nixon, ANP, AACC

## 2023-07-08 ENCOUNTER — Encounter: Payer: Self-pay | Admitting: Adult Health

## 2023-07-08 ENCOUNTER — Ambulatory Visit: Payer: Medicare Other | Attending: Adult Health | Admitting: Adult Health

## 2023-07-08 VITALS — BP 130/70 | HR 65 | Ht 71.0 in | Wt 214.8 lb

## 2023-07-08 DIAGNOSIS — I701 Atherosclerosis of renal artery: Secondary | ICD-10-CM | POA: Diagnosis not present

## 2023-07-08 DIAGNOSIS — I10 Essential (primary) hypertension: Secondary | ICD-10-CM

## 2023-07-08 DIAGNOSIS — E7800 Pure hypercholesterolemia, unspecified: Secondary | ICD-10-CM | POA: Diagnosis not present

## 2023-07-08 DIAGNOSIS — R002 Palpitations: Secondary | ICD-10-CM | POA: Diagnosis not present

## 2023-07-08 MED ORDER — NEBIVOLOL HCL 5 MG PO TABS: 5.0000 mg | ORAL_TABLET | Freq: Every morning | ORAL | 2 refills | Status: DC

## 2023-07-08 NOTE — Patient Instructions (Signed)
Medication Instructions:  No Changes *If you need a refill on your cardiac medications before your next appointment, please call your pharmacy*   Lab Work: No labs If you have labs (blood work) drawn today and your tests are completely normal, you will receive your results only by: MyChart Message (if you have MyChart) OR A paper copy in the mail If you have any lab test that is abnormal or we need to change your treatment, we will call you to review the results.   Testing/Procedures: [address], [address]. Your physician has requested that you have a renal artery duplex. During this test, an ultrasound is used to evaluate blood flow to the kidneys. Allow one hour for this exam. Do not eat after midnight the day before and avoid carbonated beverages. Take your medications as you usually do.    Follow-Up: At [facility], you and your health needs are our priority.  As part of our continuing mission to provide you with exceptional heart care, we have created designated Provider Care Teams.  These Care Teams include your primary Cardiologist (physician) and Advanced Practice Providers (APPs -  Physician Assistants and Nurse Practitioners) who all work together to provide you with the care you need, when you need it.  We recommend signing up for the patient portal called "MyChart".  Sign up information is provided on this After Visit Summary.  MyChart is used to connect with patients for Virtual Visits (Telemedicine).  Patients are able to view lab/test results, encounter notes, upcoming appointments, etc.  Non-urgent messages can be sent to your provider as well.   To learn more about what you can do with MyChart, go to [email or web address].    Your next appointment:   1 year(s)  Provider:   Nanetta Batty, MD

## 2023-07-12 ENCOUNTER — Ambulatory Visit: Payer: Medicare Other | Admitting: Adult Health

## 2023-07-17 ENCOUNTER — Ambulatory Visit (HOSPITAL_COMMUNITY)
Admission: RE | Admit: 2023-07-17 | Discharge: 2023-07-17 | Disposition: A | Discharge status: Home / Self Care | Payer: Medicare Other | Source: Ambulatory Visit | Attending: Internal Medicine | Admitting: Internal Medicine

## 2023-07-17 DIAGNOSIS — I701 Atherosclerosis of renal artery: Secondary | ICD-10-CM | POA: Diagnosis not present

## 2023-07-19 ENCOUNTER — Telehealth: Payer: Self-pay

## 2023-07-19 NOTE — Telephone Encounter (Addendum)
Called patient regarding results. Patient had understanding of results.----- Message from [name] sent at 07/17/2023 10:57 AM EDT ----- I have reviewed the renal artery ultrasound report.  No significant renal artery blockages. 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 This is unchanged from 2021 ultrasound when I reviewed it as well. Continue current medications. Follow up with Dr.Berry in 6 months unless appointment is already made. KL

## 2023-07-22 ENCOUNTER — Ambulatory Visit: Payer: Medicare Other | Admitting: Adult Health

## 2023-07-22 DIAGNOSIS — E291 Testicular hypofunction: Secondary | ICD-10-CM | POA: Diagnosis not present

## 2023-07-22 DIAGNOSIS — Z0001 Encounter for general adult medical examination with abnormal findings: Secondary | ICD-10-CM | POA: Diagnosis not present

## 2023-07-23 ENCOUNTER — Telehealth: Payer: Self-pay

## 2023-07-23 ENCOUNTER — Telehealth: Payer: Self-pay | Admitting: Cardiovascular Disease

## 2023-07-23 NOTE — Telephone Encounter (Signed)
Pt returning call regarding scheduling f/u. Was just seen so questioning if he ntbs, Requesting cb from Myna Hidalgo

## 2023-07-23 NOTE — Telephone Encounter (Signed)
Patient called regarding questions regarding appointment . Patient did not see it necessary to come in until 6 months.

## 2023-07-24 DIAGNOSIS — Z6829 Body mass index (BMI) 29.0-29.9, adult: Secondary | ICD-10-CM | POA: Diagnosis not present

## 2023-07-24 DIAGNOSIS — E291 Testicular hypofunction: Secondary | ICD-10-CM | POA: Diagnosis not present

## 2023-07-24 DIAGNOSIS — E039 Hypothyroidism, unspecified: Secondary | ICD-10-CM | POA: Diagnosis not present

## 2023-07-24 DIAGNOSIS — E6609 Other obesity due to excess calories: Secondary | ICD-10-CM | POA: Diagnosis not present

## 2023-07-24 DIAGNOSIS — I10 Essential (primary) hypertension: Secondary | ICD-10-CM | POA: Diagnosis not present

## 2023-08-29 DIAGNOSIS — E291 Testicular hypofunction: Secondary | ICD-10-CM | POA: Diagnosis not present

## 2023-09-04 DIAGNOSIS — R059 Cough, unspecified: Secondary | ICD-10-CM | POA: Diagnosis not present

## 2023-09-04 DIAGNOSIS — R519 Headache, unspecified: Secondary | ICD-10-CM | POA: Diagnosis not present

## 2023-09-04 DIAGNOSIS — J22 Unspecified acute lower respiratory infection: Secondary | ICD-10-CM | POA: Diagnosis not present

## 2023-09-04 DIAGNOSIS — J019 Acute sinusitis, unspecified: Secondary | ICD-10-CM | POA: Diagnosis not present

## 2023-09-24 DIAGNOSIS — E039 Hypothyroidism, unspecified: Secondary | ICD-10-CM | POA: Diagnosis not present

## 2023-09-25 DIAGNOSIS — E291 Testicular hypofunction: Secondary | ICD-10-CM | POA: Diagnosis not present

## 2023-09-25 DIAGNOSIS — E663 Overweight: Secondary | ICD-10-CM | POA: Diagnosis not present

## 2023-09-25 DIAGNOSIS — Z6829 Body mass index (BMI) 29.0-29.9, adult: Secondary | ICD-10-CM | POA: Diagnosis not present

## 2023-09-25 DIAGNOSIS — E039 Hypothyroidism, unspecified: Secondary | ICD-10-CM | POA: Diagnosis not present

## 2023-10-14 DIAGNOSIS — K080 Exfoliation of teeth due to systemic causes: Secondary | ICD-10-CM | POA: Diagnosis not present

## 2023-11-11 ENCOUNTER — Emergency Department (HOSPITAL_COMMUNITY): Payer: Medicare Other

## 2023-11-11 ENCOUNTER — Emergency Department (HOSPITAL_COMMUNITY)
Admission: EM | Admit: 2023-11-11 | Discharge: 2023-11-12 | Disposition: A | Discharge status: Home / Self Care | Payer: Medicare Other | Attending: Emergency Medicine | Admitting: Emergency Medicine

## 2023-11-11 ENCOUNTER — Other Ambulatory Visit: Payer: Self-pay

## 2023-11-11 DIAGNOSIS — Z79899 Other long term (current) drug therapy: Secondary | ICD-10-CM | POA: Diagnosis not present

## 2023-11-11 DIAGNOSIS — E039 Hypothyroidism, unspecified: Secondary | ICD-10-CM | POA: Insufficient documentation

## 2023-11-11 DIAGNOSIS — H538 Other visual disturbances: Secondary | ICD-10-CM | POA: Diagnosis not present

## 2023-11-11 DIAGNOSIS — I10 Essential (primary) hypertension: Secondary | ICD-10-CM | POA: Diagnosis not present

## 2023-11-11 DIAGNOSIS — R002 Palpitations: Secondary | ICD-10-CM | POA: Insufficient documentation

## 2023-11-11 DIAGNOSIS — R42 Dizziness and giddiness: Secondary | ICD-10-CM | POA: Diagnosis not present

## 2023-11-11 DIAGNOSIS — R0789 Other chest pain: Secondary | ICD-10-CM | POA: Insufficient documentation

## 2023-11-11 DIAGNOSIS — R000 Tachycardia, unspecified: Secondary | ICD-10-CM | POA: Diagnosis not present

## 2023-11-11 LAB — BASIC METABOLIC PANEL
Anion gap: 11 (ref 5–15)
BUN: 18 mg/dL (ref 8–23)
CO2: 27 mmol/L (ref 22–32)
Calcium: 10.1 mg/dL (ref 8.9–10.3)
Chloride: 98 mmol/L (ref 98–111)
Creatinine, Ser: 1.23 mg/dL (ref 0.61–1.24)
GFR, Estimated: >60 mL/min (ref >60)
Glucose, Bld: 116 mg/dL — ABNORMAL HIGH (ref 70–99)
Potassium: 4.1 mmol/L (ref 3.5–5.1)
Sodium: 136 mmol/L (ref 135–145)

## 2023-11-11 LAB — CBC
HCT: 42.3 % (ref 39.0–52.0)
Hemoglobin: 14.4 g/dL (ref 13.0–17.0)
MCH: 30.4 pg (ref 26.0–34.0)
MCHC: 34 g/dL (ref 30.0–36.0)
MCV: 89.2 fL (ref 80.0–100.0)
Platelets: 237 10*3/uL (ref 150–400)
RBC: 4.74 MIL/uL (ref 4.22–5.81)
RDW: 12.2 % (ref 11.5–15.5)
WBC: 9.8 10*3/uL (ref 4.0–10.5)
nRBC: 0 % (ref 0.0–0.2)

## 2023-11-11 LAB — TROPONIN I (HIGH SENSITIVITY)
Troponin I (High Sensitivity): <2 ng/L (ref <18)
Troponin I (High Sensitivity): 3 ng/L (ref <18)

## 2023-11-11 NOTE — ED Triage Notes (Signed)
 Pt presents after starting to have tachycardia "heart skipping" and chest pressure this evening. No n/v/d or shob.  Does take meds for his "heart rate"  states his heart rate never gets above low 60s.  Does take levothyroxine.

## 2023-11-12 ENCOUNTER — Telehealth: Payer: Self-pay | Admitting: Cardiovascular Disease

## 2023-11-12 LAB — T4, FREE: Free T4: 0.89 ng/dL (ref 0.61–1.12)

## 2023-11-12 LAB — TSH: TSH: 4.869 u[IU]/mL — ABNORMAL HIGH (ref 0.350–4.500)

## 2023-11-12 NOTE — Telephone Encounter (Signed)
 STAT if HR is under 50 or over 120 (normal HR is 60-100 beats per minute)  What is your heart rate? Last night went to ER last night on the 90's. It would go up and then down  Do you have a log of your heart rate readings (document readings)? no  Do you have any other symptoms? He was told his Troponin was high

## 2023-11-12 NOTE — ED Provider Notes (Signed)
 [place] EMERGENCY DEPARTMENT AT [facility] Provider Note   CSN: [id number] Arrival date & time: 11/11/23  2014     History  Chief Complaint  Patient presents with   Chest Pain    Daniel [name] is a 68 y.o. male.  68 yo M w/ h/o hypothyroidism, HLD, palpitations and HTN who presents to the ED secondary to palpitations. States it feels like palpitations he had when his thyroid levels were off however this time lasted much longer. He was working in the garden building raised beds when it started. Tried a beer to make it better and it didn't. States his HR is normlly in high 50's low 60's (verified in the records) and today it was in the 90's at rest when he was having the symptoms. Wonders if he might've been dehydrated. Had mild dyspnea and chest pressure during the episode. No recent illnesses, med changes, dietary changes or OTC meds that would explain a change in symptoms.    Chest Pain      Home Medications Prior to Admission medications   Medication Sig Start Date End Date Taking? Authorizing Provider  escitalopram (LEXAPRO) 10 MG tablet Take 10 mg by mouth at bedtime.     [provider]  levothyroxine (SYNTHROID) 150 MCG tablet Take 150 mcg by mouth daily. 06/10/23   [provider]  nebivolol (BYSTOLIC) 5 MG tablet Take 1 tablet (5 mg total) by mouth every morning. 07/08/23   Jodelle Gross, NP  rosuvastatin (CRESTOR) 20 MG tablet Take 1 tablet (10 mg) by mouth daily 11/14/21   Runell Gess, MD  testosterone cypionate (DEPOTESTOSTERONE CYPIONATE) 200 MG/ML injection Inject into the muscle every 14 (fourteen) days. Patient unsure of strength    [provider]  cetirizine (ZYRTEC ALLERGY) 10 MG tablet Take 1 tablet (10 mg total) by mouth daily. 07/19/20 12/03/20  Avegno, Zachery Dakins, FNP  fluticasone (FLONASE) 50 MCG/ACT nasal spray Place 1 spray into both nostrils daily for 14 days. 07/19/20 12/03/20  Durward Parcel, FNP       Allergies    Patient has no known allergies.    Review of Systems   Review of Systems  Cardiovascular:  Positive for chest pain.    Physical Exam Updated Vital Signs BP (!) 154/85   Pulse (!) 55   Temp 98.9 F (37.2 C) (Oral)   Resp 13   SpO2 97%  Physical Exam Vitals and nursing note reviewed.  Constitutional:      Appearance: He is well-developed.  HENT:     Head: Normocephalic and atraumatic.  Cardiovascular:     Rate and Rhythm: Bradycardia present.  Pulmonary:     Effort: Pulmonary effort is normal. No respiratory distress.  Abdominal:     General: There is no distension.  Musculoskeletal:        General: Normal range of motion.     Cervical back: Normal range of motion.  Neurological:     Mental Status: He is alert.     ED Results / Procedures / Treatments   Labs (all labs ordered are listed, but only abnormal results are displayed) Labs Reviewed  BASIC METABOLIC PANEL - Abnormal; Notable for the following components:      Result Value   Glucose, Bld 116 (*)    All other components within normal limits  TSH - Abnormal; Notable for the following components:   TSH 4.869 (*)    All other components within normal limits  CBC  T4, FREE  TROPONIN I (HIGH SENSITIVITY)  TROPONIN I (HIGH SENSITIVITY)    EKG EKG Interpretation Date/Time:  Monday November 11 2023 20:39:17 EST Ventricular Rate:  92 PR Interval:  150 QRS Duration:  82 QT Interval:  342 QTC Calculation: 422 R Axis:   71  Text Interpretation: Sinus rhythm with Premature atrial complexes with Abberant conduction Nonspecific ST abnormality Abnormal ECG When compared with ECG of 04-Jun-2023 14:47, Abberant conduction is now Present Confirmed by Marily Memos [phone]) on 11/12/2023 2:24:13 AM  Radiology DG Chest 2 View Result Date: 11/11/2023 CLINICAL DATA:  Tachycardia, dizziness and blurred vision. EXAM: CHEST - 2 VIEW COMPARISON:  November 21, 2010 FINDINGS: The heart size and mediastinal  contours are within normal limits. Mild biapical atelectatic changes are suspected. There is no evidence of acute infiltrate, pleural effusion or pneumothorax. The visualized skeletal structures are unremarkable. IMPRESSION: 1. No acute cardiopulmonary disease. Electronically Signed   By: Aram Candela M.D.   On: 11/11/2023 22:23    Procedures Procedures    Medications Ordered in ED Medications - No data to display  ED Course/ Medical Decision Making/ A&P                                 Medical Decision Making Amount and/or Complexity of Data Reviewed Labs: ordered. Radiology: ordered.   Thyroid studies checked for cardiology follow up. Sinus bradycardia (baseline) while on monitor here but ecg did show a couple PAC's which may have been symptomatic for him. At time of discharge patient brought up that he also has episodes of blurry vision. States that he will have blurry vision in both eyes that lasts about 30 seconds. Will improve with closing either eye. Associated with a light headed feeling. Doesn't notice any palpitations during these episodes. Didn't sound like neurologic but if HR was getting too low for a period of time that could cause it. Recommended fu w/ cardiology for possible monitoring of tachy/brady. Asymptomatic at this time. Stable for discharge.     Final Clinical Impression(s) / ED Diagnoses Final diagnoses:  Palpitations    Rx / DC Orders ED Discharge Orders     None         Anevay Campanella, Barbara Cower, MD 11/12/23 [phone]

## 2023-11-12 NOTE — Telephone Encounter (Signed)
 Patient identification verified by 2 forms. Marilynn Rail, RN    Called and spoke to patient  Patient states:   -presented to ED yesterday   -had really bad palpitations yesterday   -also had some palpitation on Saturday but yesterday was the worse   -symptoms have since resolved   -ED recommended follow up with Dr. Allyson Sabal and monitoring   -continues to take Nebivolol 5 mg daily   -also had a few episodes in the past month where vision would become blurry and he was dizzines   -those episodes/symptoms resolve on their own  Patient denies:   -Palpitations   -chest pain   -lightheadedness   -LOC/fall  Patient scheduled for OV 3/4 at 2:00pm with Dr. Allyson Sabal  Advised patient to continue monitor symptoms at home  Reviewed ED warning signs/precautions  Patient verbalized understanding, no questions at this time

## 2023-11-19 ENCOUNTER — Encounter: Payer: Self-pay | Admitting: Cardiovascular Disease

## 2023-11-19 ENCOUNTER — Ambulatory Visit: Payer: Medicare Other | Attending: Cardiovascular Disease | Admitting: Cardiovascular Disease

## 2023-11-19 VITALS — BP 126/80 | HR 65 | Ht 71.0 in | Wt 212.0 lb

## 2023-11-19 DIAGNOSIS — I701 Atherosclerosis of renal artery: Secondary | ICD-10-CM

## 2023-11-19 DIAGNOSIS — E782 Mixed hyperlipidemia: Secondary | ICD-10-CM | POA: Diagnosis not present

## 2023-11-19 DIAGNOSIS — I10 Essential (primary) hypertension: Secondary | ICD-10-CM

## 2023-11-19 DIAGNOSIS — R002 Palpitations: Secondary | ICD-10-CM

## 2023-11-19 DIAGNOSIS — R931 Abnormal findings on diagnostic imaging of heart and coronary circulation: Secondary | ICD-10-CM

## 2023-11-19 NOTE — Assessment & Plan Note (Signed)
 History of bilateral renal artery stenosis with renal Dopplers performed 07/17/2023 revealing no evidence of significant renal artery stenosis.

## 2023-11-19 NOTE — Assessment & Plan Note (Signed)
 History of palpitations in the past better since his Bystolic was uptitrated from 2.5 to 5 mg.  He did discontinue caffeine.  There is an anxiety component.  He had a event monitor performed 05/12/2021 that showed occasional PACs, PVCs and runs of SVT with rates up to 200.  He was lucid recently seen in the ER 2 weeks ago with palpitations.  Heart rate was 90 and he had PVCs on his twelve-lead electrocardiogram.

## 2023-11-19 NOTE — Assessment & Plan Note (Signed)
 History of essential hypertension her blood pressure measured today at 126/80.  He is on Bystolic.

## 2023-11-19 NOTE — Assessment & Plan Note (Signed)
 History of hyperlipidemia on statin therapy with lipid profile performed 06/06/2022 revealing total cholesterol 138, LDL of 63 and HDL 53.  I have asked him to get another fasting lipid liver profile through his PCP.

## 2023-11-19 NOTE — Assessment & Plan Note (Signed)
 Coronary calcium score of 1129 on 07/08/2019.  CTA showed no significant obstructive disease.  He really denies chest pain.

## 2023-11-19 NOTE — Patient Instructions (Signed)
 Medication Instructions:  Your physician recommends that you continue on your current medications as directed. Please refer to the Current Medication list given to you today.  *If you need a refill on your cardiac medications before your next appointment, please call your pharmacy*   Lab Work: NONE ordered at this time of appointment   Testing/Procedures: NONE ordered at this time of appointment   Follow-Up: At [facility], you and your health needs are our priority.  As part of our continuing mission to provide you with exceptional heart care, we have created designated Provider Care Teams.  These Care Teams include your primary Cardiologist (physician) and Advanced Practice Providers (APPs -  Physician Assistants and Nurse Practitioners) who all work together to provide you with the care you need, when you need it.  We recommend signing up for the patient portal called "MyChart".  Sign up information is provided on this After Visit Summary.  MyChart is used to connect with patients for Virtual Visits (Telemedicine).  Patients are able to view lab/test results, encounter notes, upcoming appointments, etc.  Non-urgent messages can be sent to your provider as well.   To learn more about what you can do with MyChart, go to [email or web address].    Your next appointment:    6 months Harriet Pho NP) 1 year Dr. Allyson Sabal  Provider:   Nanetta Batty, MD  or [name], DNP, ANP        Other Instructions

## 2023-11-19 NOTE — Progress Notes (Signed)
 11/19/2023 Daniel [name]   1956-08-25  [id number]  Primary Physician Assunta Found, MD Primary Cardiologist: Runell Gess MD [name], [place]  HPI:  Daniel [name] is a 68 y.o.  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 I last saw him in the office 11/14/2021.Marland KitchenHe was previously a patient of Dr. Susa Griffins. I apparently took care of his father prior to his death. His cardiac risk factor profile is positive for 80 pack years of tobacco abuse having quit 7  years ago, treated hypertension and mild hyperlipidemia. He has never had a heart for stroke. He denies chest pain or shortness of breath. He had a negative Myoview stress test 07/18/12 a normal 2-D echo at that time. His major complaint is of palpitations. This has been evaluated in the past with an event monitor, 2-D echo and Myoview. 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 He does admit to excessive caffeine or alcohol intake. Also has hypothyroidism on thyroid replacement therapy. Apparently has values were elevated and his primary care physician is titrating his thyroid replacement. He does complain of bilateral leg; claudication with lower extremity Dopplers Were performed 01/03/16 which were entirely normal.    He was complaining of palpitations prior to my my office visit several years ago and saw Azalee Course PA-C who titrated his beta-blocker resulting in improvement in his symptoms.     He had a coronary CTA performed 10/22 5/20 revealing no hemodynamically significant lesions with all FFR greater than 0.8.  He has had more palpitations recently.  His primary care physician cut his Bystolic in half because of bradycardia.  He does think that anxiety has a big effect on his palpitations however.  Since I saw him in the office 2 years ago he continues to do well.  He does get occasional palpitations  which have improved since [name], FNP increase his Bystolic from 2.5 to 5 mg a day.  He was seen in the ER and [facility] 2 weeks ago after working out in his yard for 5 hours with some tachypalpitations.  His heart rate was in the 90s.  His troponins were negative.  His EKG showed some nonspecific ST and T wave changes with occasional PVCs.  He was complaining of some chest tightness at that time.   Current Meds  Medication Sig   escitalopram (LEXAPRO) 10 MG tablet Take 10 mg by mouth at bedtime.    levothyroxine (SYNTHROID) 112 MCG tablet Take 150 mcg by mouth daily.   nebivolol (BYSTOLIC) 5 MG tablet Take 1 tablet (5 mg total) by mouth every morning.   rosuvastatin (CRESTOR) 10 MG tablet Take 10 mg by mouth at bedtime.   rosuvastatin (CRESTOR) 20 MG tablet Take 1 tablet (10 mg) by mouth daily   testosterone cypionate (DEPOTESTOSTERONE CYPIONATE) 200 MG/ML injection Inject into the muscle every 14 (fourteen) days. Patient unsure of strength     No Known Allergies  Social History   Socioeconomic History   Marital status: Married    Spouse name: Not on file   Number of children: Not on file   Years of education: Not on file   Highest education level: Not on file  Occupational History   Not on file  Tobacco Use   Smoking status: Former    Current packs/day: 0.00    Average packs/day:  2.0 packs/day for 40.0 years (80.0 ttl pk-yrs)    Types: Cigarettes    Start date: 12/16/1970    Quit date: 12/16/2010    Years since quitting: 12.9   Smokeless tobacco: Current    Types: Snuff   Tobacco comments:    snus  Vaping Use   Vaping status: Never Used  Substance and Sexual Activity   Alcohol use: Yes    Alcohol/week: 28.0 standard drinks of alcohol    Types: 28 Standard drinks or equivalent per week    Comment: beer 2 -5 per day- none for last few weeks as of 08/24/2021   Drug use: No   Sexual activity: Yes  Other Topics Concern   Not on file  Social History  Narrative   Not on file   Social Drivers of Health   Financial Resource Strain: Not on file  Food Insecurity: Not on file  Transportation Needs: Not on file  Physical Activity: Not on file  Stress: Not on file  Social Connections: Not on file  Intimate Partner Violence: Not on file     Review of Systems: General: negative for chills, fever, night sweats or weight changes.  Cardiovascular: negative for chest pain, dyspnea on exertion, edema, orthopnea, palpitations, paroxysmal nocturnal dyspnea or shortness of breath Dermatological: negative for rash Respiratory: negative for cough or wheezing Urologic: negative for hematuria Abdominal: negative for nausea, vomiting, diarrhea, bright red blood per rectum, melena, or hematemesis Neurologic: negative for visual changes, syncope, or dizziness All other systems reviewed and are otherwise negative except as noted above.    Blood pressure 126/80, pulse 65, height 5\' 11"  (1.803 m), weight 212 lb (96.2 kg), SpO2 97%.  General appearance: alert and no distress Neck: no adenopathy, no carotid bruit, no JVD, supple, symmetrical, trachea midline, and thyroid not enlarged, symmetric, no tenderness/mass/nodules Lungs: clear to auscultation bilaterally Heart: regular rate and rhythm, S1, S2 normal, no murmur, click, rub or gallop Extremities: extremities normal, atraumatic, no cyanosis or edema Pulses: 2+ and symmetric Skin: Skin color, texture, turgor normal. No rashes or lesions Neurologic: Grossly normal  EKG not performed today      ASSESSMENT AND PLAN:   Hypertension History of essential hypertension her blood pressure measured today at 126/80.  He is on Bystolic.  Hyperlipidemia History of hyperlipidemia on statin therapy with lipid profile performed 06/06/2022 revealing total cholesterol 138, LDL of 63 and HDL 53.  I have asked him to get another fasting lipid liver profile through his PCP.  Palpitations History of palpitations  in the past better since his Bystolic was uptitrated from 2.5 to 5 mg.  He did discontinue caffeine.  There is an anxiety component.  He had a event monitor performed 05/12/2021 that showed occasional PACs, PVCs and runs of SVT with rates up to 200.  He was lucid recently seen in the ER 2 weeks ago with palpitations.  Heart rate was 90 and he had PVCs on his twelve-lead electrocardiogram.  Bilateral renal artery stenosis (HCC) History of bilateral renal artery stenosis with renal Dopplers performed 07/17/2023 revealing no evidence of significant renal artery stenosis.  Elevated coronary artery calcium score Coronary calcium score of 1129 on 07/08/2019.  CTA showed no significant obstructive disease.  He really denies chest pain.     Runell Gess MD FACP,FACC,FAHA, [facility] 11/19/2023 2:25 PM

## 2023-11-29 DIAGNOSIS — Z6831 Body mass index (BMI) 31.0-31.9, adult: Secondary | ICD-10-CM | POA: Diagnosis not present

## 2023-11-29 DIAGNOSIS — R059 Cough, unspecified: Secondary | ICD-10-CM | POA: Diagnosis not present

## 2023-11-29 DIAGNOSIS — R030 Elevated blood-pressure reading, without diagnosis of hypertension: Secondary | ICD-10-CM | POA: Diagnosis not present

## 2023-11-29 DIAGNOSIS — J22 Unspecified acute lower respiratory infection: Secondary | ICD-10-CM | POA: Diagnosis not present

## 2023-12-11 DIAGNOSIS — H52223 Regular astigmatism, bilateral: Secondary | ICD-10-CM | POA: Diagnosis not present

## 2023-12-11 DIAGNOSIS — Z6829 Body mass index (BMI) 29.0-29.9, adult: Secondary | ICD-10-CM | POA: Diagnosis not present

## 2023-12-11 DIAGNOSIS — E663 Overweight: Secondary | ICD-10-CM | POA: Diagnosis not present

## 2023-12-11 DIAGNOSIS — J0100 Acute maxillary sinusitis, unspecified: Secondary | ICD-10-CM | POA: Diagnosis not present

## 2023-12-12 ENCOUNTER — Other Ambulatory Visit (HOSPITAL_COMMUNITY): Payer: Self-pay | Admitting: Family Medicine

## 2023-12-12 DIAGNOSIS — I701 Atherosclerosis of renal artery: Secondary | ICD-10-CM

## 2023-12-17 ENCOUNTER — Ambulatory Visit (HOSPITAL_COMMUNITY)
Admission: RE | Admit: 2023-12-17 | Discharge: 2023-12-17 | Disposition: A | Discharge status: Home / Self Care | Source: Ambulatory Visit | Attending: Family Medicine | Admitting: Family Medicine

## 2023-12-17 DIAGNOSIS — I701 Atherosclerosis of renal artery: Secondary | ICD-10-CM | POA: Insufficient documentation

## 2023-12-17 DIAGNOSIS — I6523 Occlusion and stenosis of bilateral carotid arteries: Secondary | ICD-10-CM | POA: Diagnosis not present

## 2024-02-05 DIAGNOSIS — F3342 Major depressive disorder, recurrent, in full remission: Secondary | ICD-10-CM | POA: Diagnosis not present

## 2024-02-05 DIAGNOSIS — Z87891 Personal history of nicotine dependence: Secondary | ICD-10-CM | POA: Diagnosis not present

## 2024-03-03 DIAGNOSIS — H18413 Arcus senilis, bilateral: Secondary | ICD-10-CM | POA: Diagnosis not present

## 2024-03-03 DIAGNOSIS — H02831 Dermatochalasis of right upper eyelid: Secondary | ICD-10-CM | POA: Diagnosis not present

## 2024-03-03 DIAGNOSIS — Z961 Presence of intraocular lens: Secondary | ICD-10-CM | POA: Diagnosis not present

## 2024-03-03 DIAGNOSIS — H26492 Other secondary cataract, left eye: Secondary | ICD-10-CM | POA: Diagnosis not present

## 2024-03-03 DIAGNOSIS — H26493 Other secondary cataract, bilateral: Secondary | ICD-10-CM | POA: Diagnosis not present

## 2024-03-12 ENCOUNTER — Telehealth: Payer: Self-pay | Admitting: Acute Care

## 2024-03-12 ENCOUNTER — Ambulatory Visit (INDEPENDENT_AMBULATORY_CARE_PROVIDER_SITE_OTHER): Admitting: Family Medicine

## 2024-03-12 ENCOUNTER — Encounter: Payer: Self-pay | Admitting: Emergency Medicine

## 2024-03-12 VITALS — BP 133/78 | HR 65 | Temp 98.4°F | Ht 71.0 in | Wt 208.0 lb

## 2024-03-12 DIAGNOSIS — Z87891 Personal history of nicotine dependence: Secondary | ICD-10-CM | POA: Diagnosis not present

## 2024-03-12 DIAGNOSIS — Z122 Encounter for screening for malignant neoplasm of respiratory organs: Secondary | ICD-10-CM

## 2024-03-12 DIAGNOSIS — I700 Atherosclerosis of aorta: Secondary | ICD-10-CM | POA: Insufficient documentation

## 2024-03-12 DIAGNOSIS — I10 Essential (primary) hypertension: Secondary | ICD-10-CM | POA: Diagnosis not present

## 2024-03-12 DIAGNOSIS — E039 Hypothyroidism, unspecified: Secondary | ICD-10-CM

## 2024-03-12 DIAGNOSIS — I7121 Aneurysm of the ascending aorta, without rupture: Secondary | ICD-10-CM | POA: Insufficient documentation

## 2024-03-12 DIAGNOSIS — E782 Mixed hyperlipidemia: Secondary | ICD-10-CM

## 2024-03-12 DIAGNOSIS — R002 Palpitations: Secondary | ICD-10-CM

## 2024-03-12 DIAGNOSIS — J439 Emphysema, unspecified: Secondary | ICD-10-CM | POA: Diagnosis not present

## 2024-03-12 DIAGNOSIS — R931 Abnormal findings on diagnostic imaging of heart and coronary circulation: Secondary | ICD-10-CM

## 2024-03-12 DIAGNOSIS — I701 Atherosclerosis of renal artery: Secondary | ICD-10-CM

## 2024-03-12 DIAGNOSIS — I2510 Atherosclerotic heart disease of native coronary artery without angina pectoris: Secondary | ICD-10-CM | POA: Insufficient documentation

## 2024-03-12 DIAGNOSIS — Z130 Encounter for screening for diseases of the blood and blood-forming organs and certain disorders involving the immune mechanism: Secondary | ICD-10-CM

## 2024-03-12 DIAGNOSIS — E291 Testicular hypofunction: Secondary | ICD-10-CM

## 2024-03-12 NOTE — Patient Instructions (Signed)
 Labs in the AM.  Referrals placed.  Follow up in 6 months.  Take care  Dr. Bluford

## 2024-03-12 NOTE — Telephone Encounter (Signed)
 Lung Cancer Screening Narrative/Criteria Questionnaire (Cigarette Smokers Only- No Cigars/Pipes/vapes)   Daniel [name]   SDMV:03/25/2024 9:00a [name]       Dec 07, 1955   LDCT: 04/02/2024 9:30am AP    67 y.o.   Phone: [phone]  Lung Screening Narrative (confirm age 6-77 yrs Medicare / 50-80 yrs Private pay insurance)   Insurance information:BCBS   Referring Provider:Dr. Isaiah   This screening involves an initial phone call with a team member from our program. It is called a shared decision making visit. The initial meeting is required by  insurance and Medicare to make sure you understand the program. This appointment takes about 15-20 minutes to complete. You will complete the screening scan at your scheduled date/time.  This scan takes about 5-10 minutes to complete. You can eat and drink normally before and after the scan.  Criteria questions for Lung Cancer Screening:   Are you a current or former smoker? Current Age began smoking: 68yo   If you are a former smoker, what year did you quit smoking? 2011(within 15 yrs)   To calculate your smoking history, I need an accurate estimate of how many packs of cigarettes you smoked per day and for how many years. (Not just the number of PPD you are now smoking)   Years smoking 40 x Packs per day 1.5 = Pack years 60   (at least 20 pack yrs)   (Make sure they understand that we need to know how much they have smoked in the past, not just the number of PPD they are smoking now)  Do you have a personal history of cancer?  No    Do you have a family history of cancer? Yes  (cancer type and and relative) Father - lung, mother - pancreatic   Are you coughing up blood?  No  Have you had unexplained weight loss of 15 lbs or more in the last 6 months? No  It looks like you meet all criteria.  When would be a good time for us  to schedule you for this screening?   Additional information: N/A

## 2024-03-15 DIAGNOSIS — E039 Hypothyroidism, unspecified: Secondary | ICD-10-CM | POA: Insufficient documentation

## 2024-03-15 DIAGNOSIS — E291 Testicular hypofunction: Secondary | ICD-10-CM | POA: Insufficient documentation

## 2024-03-15 NOTE — Progress Notes (Signed)
 Subjective:  Patient ID: Daniel [name], male    DOB: Feb 25, 1956  Age: 68 y.o. MRN: [id number]  CC:   Chief Complaint  Patient presents with   New Patient (Initial Visit)    Here to establish care Would like labs for thyroid , chol and testosterone  and Lung cancer screening    HPI:  68 year old male with the below mentioned medical problems presents to establish care.  Patient reports that overall he's doing well.  Currently on testosterone  injection. Previously seen by Urology ([place]). Would like referral to return to urology.  Former smoker. Would like lung cancer screening.   HTN stable on Nebivolol .   Needs labs today.  Follows closely with cardiology.  Patient Active Problem List   Diagnosis Date Noted   Hypogonadism in male 03/15/2024   Hypothyroidism 03/15/2024   Ascending aortic aneurysm (HCC) 03/12/2024   Emphysema of lung (HCC) 03/12/2024   3-vessel coronary artery disease 03/12/2024   Aortic atherosclerosis (HCC) 03/12/2024   Bilateral renal artery stenosis (HCC) 10/05/2015   Palpitations 10/26/2014   Essential hypertension 07/17/2013   Hyperlipidemia 07/17/2013    Social Hx   Social History   Socioeconomic History   Marital status: Married    Spouse name: Not on file   Number of children: Not on file   Years of education: Not on file   Highest education level: Some college, no degree  Occupational History   Not on file  Tobacco Use   Smoking status: Former    Current packs/day: 0.00    Average packs/day: 2.0 packs/day for 40.0 years (80.0 ttl pk-yrs)    Types: Cigarettes    Start date: 12/16/1970    Quit date: 12/16/2010    Years since quitting: 13.2   Smokeless tobacco: Current    Types: Snuff   Tobacco comments:    snus  Vaping Use   Vaping status: Never Used  Substance and Sexual Activity   Alcohol use: Yes    Alcohol/week: 28.0 standard drinks of alcohol    Types: 28 Standard drinks or equivalent per week    Comment: beer 2 -5  per day- none for last few weeks as of 08/24/2021   Drug use: No   Sexual activity: Yes  Other Topics Concern   Not on file  Social History Narrative   Not on file   Social Drivers of Health   Financial Resource Strain: Low Risk  (03/12/2024)   Overall Financial Resource Strain (CARDIA)    Difficulty of Paying Living Expenses: Not hard at all  Food Insecurity: No Food Insecurity (03/12/2024)   Hunger Vital Sign    Worried About Running Out of Food in the Last Year: Never true    Ran Out of Food in the Last Year: Never true  Transportation Needs: No Transportation Needs (03/12/2024)   PRAPARE - Administrator, Civil Service (Medical): No    Lack of Transportation (Non-Medical): No  Physical Activity: Inactive (03/12/2024)   Exercise Vital Sign    Days of Exercise per Week: 0 days    Minutes of Exercise per Session: Not on file  Stress: No Stress Concern Present (03/12/2024)   [employer] of Occupational Health - Occupational Stress Questionnaire    Feeling of Stress: Only a little  Social Connections: Moderately Integrated (03/12/2024)   Social Connection and Isolation Panel    Frequency of Communication with Friends and Family: More than three times a week    Frequency of Social Gatherings  with Friends and Family: Once a week    Attends Religious Services: More than 4 times per year    Active Member of Clubs or Organizations: No    Attends Engineer, structural: Not on file    Marital Status: Married    Review of Systems  Constitutional: Negative.   Respiratory: Negative.      Objective:  BP 133/78   Pulse 65   Temp 98.4 F (36.9 C)   Ht 5' 11 (1.803 m)   Wt 208 lb (94.3 kg)   SpO2 97%   BMI 29.01 kg/m      03/12/2024   10:37 AM 11/19/2023    1:56 PM 11/12/2023    2:30 AM  BP/Weight  Systolic BP 133 126 154  Diastolic BP 78 80 85  Wt. (Lbs) 208 212   BMI 29.01 kg/m2 29.57 kg/m2     Physical Exam Vitals and nursing note reviewed.   Constitutional:      General: He is not in acute distress.    Appearance: Normal appearance.  HENT:     Head: Normocephalic and atraumatic.   Cardiovascular:     Rate and Rhythm: Normal rate and regular rhythm.  Pulmonary:     Effort: Pulmonary effort is normal.     Breath sounds: Normal breath sounds.   Neurological:     Mental Status: He is alert.   Psychiatric:        Mood and Affect: Mood normal.        Behavior: Behavior normal.     Lab Results  Component Value Date   WBC 9.8 11/11/2023   HGB 14.4 11/11/2023   HCT 42.3 11/11/2023   PLT 237 11/11/2023   GLUCOSE 116 (H) 11/11/2023   CHOL 124 04/24/2022   TRIG 98 04/24/2022   HDL 47 04/24/2022   LDLDIRECT 114 (H) 08/19/2019   LDLCALC 59 04/24/2022   ALT 26 04/24/2022   AST 34 04/24/2022   NA 136 11/11/2023   K 4.1 11/11/2023   CL 98 11/11/2023   CREATININE 1.23 11/11/2023   BUN 18 11/11/2023   CO2 27 11/11/2023   TSH 4.869 (H) 11/11/2023     Assessment & Plan:  Essential hypertension Assessment & Plan: Stable. Continue Nebivolol .   Orders: -     CMP14+EGFR  Mixed hyperlipidemia Assessment & Plan: Unsure of control. Continue crestor . Lipid panel ordered.   Orders: -     Lipid panel  Pulmonary emphysema, unspecified emphysema type (HCC) Assessment & Plan: Referring for lung cancer screening.  Orders: -     Ambulatory Referral for Lung Cancer Scre  Former smoker -     Ambulatory Referral for Lung Cancer Scre  Hypogonadism in male Assessment & Plan: Currently off testosterone . Obtaining labs. Referring to Urology.  Orders: -     Testosterone  -     Ambulatory referral to Urology  Hypothyroidism, unspecified type Assessment & Plan: Labs ordered. Continue current dosing of Levothyroxine.  Orders: -     TSH + free T4  Screening for deficiency anemia -     CBC    Follow-up:  Return in about 6 months (around 09/11/2024).  Jacqulyn Ahle DO [facility]

## 2024-03-15 NOTE — Assessment & Plan Note (Signed)
Referring for lung cancer screening.

## 2024-03-15 NOTE — Assessment & Plan Note (Signed)
 Labs ordered. Continue current dosing of Levothyroxine.

## 2024-03-15 NOTE — Assessment & Plan Note (Signed)
 Currently off testosterone . Obtaining labs. Referring to Urology.

## 2024-03-15 NOTE — Assessment & Plan Note (Signed)
Stable. Continue Nebivolol.

## 2024-03-15 NOTE — Assessment & Plan Note (Signed)
 Unsure of control. Continue crestor . Lipid panel ordered.

## 2024-03-16 DIAGNOSIS — E782 Mixed hyperlipidemia: Secondary | ICD-10-CM | POA: Diagnosis not present

## 2024-03-16 DIAGNOSIS — I10 Essential (primary) hypertension: Secondary | ICD-10-CM | POA: Diagnosis not present

## 2024-03-16 DIAGNOSIS — Z130 Encounter for screening for diseases of the blood and blood-forming organs and certain disorders involving the immune mechanism: Secondary | ICD-10-CM | POA: Diagnosis not present

## 2024-03-16 DIAGNOSIS — E039 Hypothyroidism, unspecified: Secondary | ICD-10-CM | POA: Diagnosis not present

## 2024-03-16 DIAGNOSIS — E291 Testicular hypofunction: Secondary | ICD-10-CM | POA: Diagnosis not present

## 2024-03-17 ENCOUNTER — Ambulatory Visit: Payer: Self-pay | Admitting: Family Medicine

## 2024-03-18 LAB — CMP14+EGFR
ALT: 22 IU/L (ref 0–44)
AST: 30 IU/L (ref 0–40)
Albumin: 4.7 g/dL (ref 3.9–4.9)
Alkaline Phosphatase: 63 IU/L (ref 44–121)
BUN/Creatinine Ratio: 9 — AB (ref 10–24)
BUN: 10 mg/dL (ref 8–27)
Bilirubin Total: 0.6 mg/dL (ref 0.0–1.2)
CO2: 23 mmol/L (ref 20–29)
Calcium: 9.7 mg/dL (ref 8.6–10.2)
Chloride: 98 mmol/L (ref 96–106)
Creatinine, Ser: 1.07 mg/dL (ref 0.76–1.27)
Globulin, Total: 2.5 g/dL (ref 1.5–4.5)
Glucose: 96 mg/dL (ref 70–99)
Potassium: 5 mmol/L (ref 3.5–5.2)
Sodium: 138 mmol/L (ref 134–144)
Total Protein: 7.2 g/dL (ref 6.0–8.5)
eGFR: 75 mL/min/{1.73_m2} (ref >59)

## 2024-03-18 LAB — TSH+FREE T4
Free T4: 1.51 ng/dL (ref 0.82–1.77)
TSH: 0.656 u[IU]/mL (ref 0.450–4.500)

## 2024-03-18 LAB — LIPID PANEL
Chol/HDL Ratio: 2.3 ratio (ref 0.0–5.0)
Cholesterol, Total: 140 mg/dL (ref 100–199)
HDL: 60 mg/dL (ref >39)
LDL Chol Calc (NIH): 58 mg/dL (ref 0–99)
Triglycerides: 129 mg/dL (ref 0–149)
VLDL Cholesterol Cal: 22 mg/dL (ref 5–40)

## 2024-03-18 LAB — CBC
Hematocrit: 44.1 % (ref 37.5–51.0)
Hemoglobin: 15 g/dL (ref 13.0–17.7)
MCH: 32.3 pg (ref 26.6–33.0)
MCHC: 34 g/dL (ref 31.5–35.7)
MCV: 95 fL (ref 79–97)
Platelets: 225 10*3/uL (ref 150–450)
RBC: 4.65 x10E6/uL (ref 4.14–5.80)
RDW: 12.4 % (ref 11.6–15.4)
WBC: 5.8 10*3/uL (ref 3.4–10.8)

## 2024-03-18 LAB — TESTOSTERONE: Testosterone: 276 ng/dL (ref 264–916)

## 2024-03-25 ENCOUNTER — Encounter: Payer: Self-pay | Admitting: Adult Health

## 2024-03-25 ENCOUNTER — Ambulatory Visit: Admitting: Adult Health

## 2024-03-25 DIAGNOSIS — Z87891 Personal history of nicotine dependence: Secondary | ICD-10-CM

## 2024-03-25 NOTE — Patient Instructions (Signed)

## 2024-03-25 NOTE — Progress Notes (Signed)
  Virtual Visit via Telephone Note  I connected with Daniel [name] , 03/25/24 9:02 AM by a telemedicine application and verified that I am speaking with the correct person using two identifiers.  Location: Patient: home Provider: home   I discussed the limitations of evaluation and management by telemedicine and the availability of in person appointments. The patient expressed understanding and agreed to proceed.   Shared Decision Making Visit Lung Cancer Screening Program [phone])   Eligibility: 68 y.o. Pack Years Smoking History Calculation = 60 pack years  (# packs/per year x # years smoked) Recent History of coughing up blood  no Unexplained weight loss? no ( >Than 15 pounds within the last 6 months ) Prior History Lung / other cancer no (Diagnosis within the last 5 years already requiring surveillance chest CT Scans). Smoking Status Former Smoker Former Smokers: Years since quit: 14 years  Quit Date: 2011  Visit Components: Discussion included one or more decision making aids. YES Discussion included risk/benefits of screening. YES Discussion included potential follow up diagnostic testing for abnormal scans. YES Discussion included meaning and risk of over diagnosis. YES Discussion included meaning and risk of False Positives. YES Discussion included meaning of total radiation exposure. YES  Counseling Included: Importance of adherence to annual lung cancer LDCT screening. YES Impact of comorbidities on ability to participate in the program. YES Ability and willingness to under diagnostic treatment. YES  Smoking Cessation Counseling: Former Smokers:  Discussed the importance of maintaining cigarette abstinence. yes Diagnosis Code: Personal History of Nicotine Dependence. S12.108 Information about tobacco cessation classes and interventions provided to patient. Yes Patient provided with ticket for LDCT Scan. yes Z12.2-Screening of respiratory  organs Z87.891-Personal history of nicotine dependence   Daniel [name] 03/25/24

## 2024-03-31 DIAGNOSIS — K080 Exfoliation of teeth due to systemic causes: Secondary | ICD-10-CM | POA: Diagnosis not present

## 2024-04-02 ENCOUNTER — Ambulatory Visit (HOSPITAL_COMMUNITY)
Admission: RE | Admit: 2024-04-02 | Discharge: 2024-04-02 | Disposition: A | Discharge status: Home / Self Care | Source: Ambulatory Visit | Attending: Acute Care | Admitting: Acute Care

## 2024-04-02 DIAGNOSIS — Z122 Encounter for screening for malignant neoplasm of respiratory organs: Secondary | ICD-10-CM | POA: Insufficient documentation

## 2024-04-02 DIAGNOSIS — Z87891 Personal history of nicotine dependence: Secondary | ICD-10-CM | POA: Insufficient documentation

## 2024-04-13 ENCOUNTER — Other Ambulatory Visit: Payer: Self-pay

## 2024-04-13 DIAGNOSIS — K080 Exfoliation of teeth due to systemic causes: Secondary | ICD-10-CM | POA: Diagnosis not present

## 2024-04-13 DIAGNOSIS — Z87891 Personal history of nicotine dependence: Secondary | ICD-10-CM

## 2024-04-13 DIAGNOSIS — Z122 Encounter for screening for malignant neoplasm of respiratory organs: Secondary | ICD-10-CM

## 2024-05-06 DIAGNOSIS — K080 Exfoliation of teeth due to systemic causes: Secondary | ICD-10-CM | POA: Diagnosis not present

## 2024-05-12 DIAGNOSIS — R070 Pain in throat: Secondary | ICD-10-CM | POA: Diagnosis not present

## 2024-05-12 DIAGNOSIS — J069 Acute upper respiratory infection, unspecified: Secondary | ICD-10-CM | POA: Diagnosis not present

## 2024-05-15 ENCOUNTER — Other Ambulatory Visit: Payer: Self-pay | Admitting: Family Medicine

## 2024-05-15 ENCOUNTER — Encounter: Payer: Self-pay | Admitting: Family Medicine

## 2024-05-15 ENCOUNTER — Ambulatory Visit (INDEPENDENT_AMBULATORY_CARE_PROVIDER_SITE_OTHER): Admitting: Family Medicine

## 2024-05-15 VITALS — BP 130/80 | HR 60 | Temp 98.6°F | Resp 16 | Ht 71.0 in | Wt 205.0 lb

## 2024-05-15 DIAGNOSIS — J0100 Acute maxillary sinusitis, unspecified: Secondary | ICD-10-CM

## 2024-05-15 MED ORDER — AMOXICILLIN-POT CLAVULANATE 875-125 MG PO TABS: 1.0000 | ORAL_TABLET | Freq: Two times a day (BID) | ORAL | 0 refills | Status: AC

## 2024-05-15 MED ORDER — PROMETHAZINE-DM 6.25-15 MG/5ML PO SYRP: 5.0000 mL | ORAL_SOLUTION | Freq: Four times a day (QID) | ORAL | 0 refills | Status: DC | PRN

## 2024-05-15 NOTE — Progress Notes (Signed)
 Acute Office Visit  Subjective:    Patient ID: Daniel [name], male    DOB: 1955-12-30, 68 y.o.   MRN: [id number]  Chief Complaint  Patient presents with   Sore Throat    Sore throat, congestion draining down, ears feel full, coughing up small amount of clear mucus with a tinge of yellow x Sunday. On tues went to UC and tested negative for covid, flu and strep throat. Has been using coricedin    HPI Patient is in today with the above complaints.  For the details of today's visit, please refer to the assessment and plan.     Past Medical History:  Diagnosis Date   Anxiety    Bilateral renal artery stenosis (HCC)    History of tobacco abuse    Hyperlipidemia    Hypertension    Hypothyroidism    Palpitations    Thyroid  disease     Past Surgical History:  Procedure Laterality Date   BIOPSY  08/29/2021   Procedure: BIOPSY;  Surgeon: Eartha Angelia Sieving, MD;  [facility];  Service: Gastroenterology;;   COLONOSCOPY N/A 07/11/2016   Procedure: COLONOSCOPY;  Surgeon: Claudis RAYMOND Rivet, MD;  [facility];  Service: Endoscopy;  Laterality: N/A;  930   COLONOSCOPY WITH PROPOFOL  N/A 08/29/2021   Procedure: COLONOSCOPY WITH PROPOFOL ;  Surgeon: Eartha Angelia Sieving, MD;  [facility];  Service: Gastroenterology;  Laterality: N/A;  1200   EYE SURGERY     MENISCUS REPAIR Left 05/29/13   POLYPECTOMY  07/11/2016   Procedure: POLYPECTOMY;  Surgeon: Claudis RAYMOND Rivet, MD;  [facility];  Service: Endoscopy;;  colon   POLYPECTOMY  08/29/2021   Procedure: POLYPECTOMY;  Surgeon: Eartha Angelia Sieving, MD;  [facility];  Service: Gastroenterology;;   US  ECHOCARDIOGRAPHY  07/18/2012    Family History  Problem Relation Age of Onset   Hyperlipidemia Father    Hypertension Father    Stroke Father    CAD Maternal Grandmother    Heart attack Maternal Grandfather    Cancer Mother    Diabetes Mother    Hypertension Mother      Social History   Socioeconomic History   Marital status: Married    Spouse name: Not on file   Number of children: Not on file   Years of education: Not on file   Highest education level: Some college, no degree  Occupational History   Not on file  Tobacco Use   Smoking status: Former    Current packs/day: 0.00    Average packs/day: 2.0 packs/day for 40.0 years (80.0 ttl pk-yrs)    Types: Cigarettes    Start date: 12/16/1970    Quit date: 12/16/2010    Years since quitting: 13.4   Smokeless tobacco: Current    Types: Snuff   Tobacco comments:    snus  Vaping Use   Vaping status: Never Used  Substance and Sexual Activity   Alcohol use: Yes    Alcohol/week: 28.0 standard drinks of alcohol    Types: 28 Standard drinks or equivalent per week    Comment: beer 2 -5 per day- none for last few weeks as of 08/24/2021   Drug use: No   Sexual activity: Yes  Other Topics Concern   Not on file  Social History Narrative   Not on file   Social Drivers of Health   Financial Resource Strain: Low Risk  (03/12/2024)   Overall Financial Resource Strain (CARDIA)  Difficulty of Paying Living Expenses: Not hard at all  Food Insecurity: No Food Insecurity (03/12/2024)   Hunger Vital Sign    Worried About Running Out of Food in the Last Year: Never true    Ran Out of Food in the Last Year: Never true  Transportation Needs: No Transportation Needs (03/12/2024)   PRAPARE - Administrator, Civil Service (Medical): No    Lack of Transportation (Non-Medical): No  Physical Activity: Inactive (03/12/2024)   Exercise Vital Sign    Days of Exercise per Week: 0 days    Minutes of Exercise per Session: Not on file  Stress: No Stress Concern Present (03/12/2024)   [employer] of Occupational Health - Occupational Stress Questionnaire    Feeling of Stress: Only a little  Social Connections: Moderately Integrated (03/12/2024)   Social Connection and Isolation Panel    Frequency of  Communication with Friends and Family: More than three times a week    Frequency of Social Gatherings with Friends and Family: Once a week    Attends Religious Services: More than 4 times per year    Active Member of [employer] or Organizations: No    Attends Engineer, structural: Not on file    Marital Status: Married  Catering manager Violence: Not on file    Outpatient Medications Prior to Visit  Medication Sig Dispense Refill   escitalopram (LEXAPRO) 10 MG tablet Take 10 mg by mouth at bedtime.      nebivolol  (BYSTOLIC ) 5 MG tablet Take 1 tablet (5 mg total) by mouth every morning. 90 tablet 2   rosuvastatin  (CRESTOR ) 10 MG tablet Take 10 mg by mouth at bedtime.     testosterone  cypionate (DEPOTESTOSTERONE CYPIONATE) 200 MG/ML injection Inject into the muscle every 14 (fourteen) days. Patient unsure of strength     levothyroxine (SYNTHROID) 125 MCG tablet Take 125 mcg by mouth daily before breakfast.     No facility-administered medications prior to visit.    No Known Allergies  Review of Systems  Constitutional:  Negative for fatigue and fever.  HENT:  Positive for congestion, postnasal drip, sinus pressure and sore throat. Negative for sneezing.   Eyes:  Negative for visual disturbance.  Respiratory:  Positive for cough. Negative for chest tightness and shortness of breath.   Cardiovascular:  Negative for chest pain and palpitations.  Neurological:  Negative for dizziness and headaches.       Objective:    Physical Exam HENT:     Head: Normocephalic.     Right Ear: External ear normal.     Left Ear: External ear normal.     Nose: No congestion or rhinorrhea.     Right Sinus: Maxillary sinus tenderness present.     Left Sinus: Maxillary sinus tenderness present.     Mouth/Throat:     Mouth: Mucous membranes are moist.  Cardiovascular:     Rate and Rhythm: Regular rhythm.     Heart sounds: No murmur heard. Pulmonary:     Effort: No respiratory distress.      Breath sounds: Normal breath sounds.  Neurological:     Mental Status: He is alert.     BP 130/80   Pulse 60   Temp 98.6 F (37 C) (Oral)   Resp 16   Ht 5' 11 (1.803 m)   Wt 205 lb (93 kg)   SpO2 94%   BMI 28.59 kg/m  Wt Readings from Last 3 Encounters:  05/15/24 205 lb (93  kg)  03/12/24 208 lb (94.3 kg)  11/19/23 212 lb (96.2 kg)       Assessment & Plan:  Subacute maxillary sinusitis Assessment & Plan: -Start Promethazine  DM 5 mL by mouth every 4 hours as needed for cough and cold symptoms. -Start Augmentin  for anaerobic coverage  -Take Tylenol as needed for pain, fever, or general discomfort.  Supportive Care: -Hydration: Increase fluid intake. -Rest: Allow for plenty of rest. -Warm saltwater gargles 3-4 times daily ( teaspoon of salt in a glass of warm water ). -Ginger tea for throat irritation and nausea. -Sugar-free or honey-based throat lozenges. -Use a humidifier at bedtime. -Heated, humidified air for nasal congestion. -Saline nasal sprays to help alleviate nasal symptoms.  Orders: -     Amoxicillin -Pot Clavulanate; Take 1 tablet by mouth 2 (two) times daily for 5 days.  Dispense: 10 tablet; Refill: 0 -     Promethazine -DM; Take 5 mLs by mouth 4 (four) times daily as needed.  Dispense: 118 mL; Refill: 0  Note: This chart has been completed using Engineer, civil (consulting) software, and while attempts have been made to ensure accuracy, certain words and phrases may not be transcribed as intended.    Shamus Desantis, FNP

## 2024-05-15 NOTE — Patient Instructions (Addendum)
 I appreciate the opportunity to provide care to you today!  Sinusitis  -Start Promethazine  DM 5 mL by mouth every 4 hours as needed for cough and cold symptoms. -Start Augmentin  for anaerobic coverage  -Take Tylenol as needed for pain, fever, or general discomfort.  Supportive Care: -Hydration: Increase fluid intake. -Rest: Allow for plenty of rest. -Warm saltwater gargles 3-4 times daily ( teaspoon of salt in a glass of warm water ). -Ginger tea for throat irritation and nausea. -Sugar-free or honey-based throat lozenges. -Use a humidifier at bedtime. -Heated, humidified air for nasal congestion. -Saline nasal sprays to help alleviate nasal symptoms.   Please follow up if your symptoms worsen or fail to improve.  .   Please continue to a heart-healthy diet and increase your physical activities. Try to exercise for at least five days a week.    It was a pleasure to see you and I look forward to continuing to work together on your health and well-being. Please do not hesitate to call the office if you need care or have questions about your care.  In case of emergency, please visit the Emergency Department for urgent care, or contact our clinic at [phone] to schedule an appointment. We're here to help you!   Have a wonderful day and week. With Gratitude, Sorin Frimpong MSN, FNP-BC

## 2024-05-16 DIAGNOSIS — J0100 Acute maxillary sinusitis, unspecified: Secondary | ICD-10-CM | POA: Insufficient documentation

## 2024-05-16 NOTE — Assessment & Plan Note (Signed)
-  Start Promethazine  DM 5 mL by mouth every 4 hours as needed for cough and cold symptoms. -Start Augmentin  for anaerobic coverage  -Take Tylenol as needed for pain, fever, or general discomfort.  Supportive Care: -Hydration: Increase fluid intake. -Rest: Allow for plenty of rest. -Warm saltwater gargles 3-4 times daily ( teaspoon of salt in a glass of warm water ). -Ginger tea for throat irritation and nausea. -Sugar-free or honey-based throat lozenges. -Use a humidifier at bedtime. -Heated, humidified air for nasal congestion. -Saline nasal sprays to help alleviate nasal symptoms.

## 2024-05-20 ENCOUNTER — Ambulatory Visit: Admitting: Physician Assistant

## 2024-06-24 ENCOUNTER — Telehealth: Payer: Self-pay | Admitting: Family Medicine

## 2024-06-24 NOTE — Telephone Encounter (Signed)
 Spoke with patient to schedule his AWV. He asked about his referral to a pulmonologist.   Stated that he had Lung screening back in July and thought he was to be referred to pulmonologist.    Thank you,  Corean,  AMB Clinical Support [facility] AWV Program Direct Dial ??[phone]

## 2024-06-30 ENCOUNTER — Other Ambulatory Visit: Payer: Self-pay

## 2024-06-30 DIAGNOSIS — J439 Emphysema, unspecified: Secondary | ICD-10-CM

## 2024-06-30 DIAGNOSIS — Z87891 Personal history of nicotine dependence: Secondary | ICD-10-CM

## 2024-07-13 ENCOUNTER — Other Ambulatory Visit: Payer: Self-pay | Admitting: Adult Health

## 2024-07-13 DIAGNOSIS — R002 Palpitations: Secondary | ICD-10-CM

## 2024-07-28 ENCOUNTER — Telehealth: Payer: Self-pay | Admitting: Internal Medicine

## 2024-07-28 ENCOUNTER — Encounter: Payer: Self-pay | Admitting: Internal Medicine

## 2024-07-28 ENCOUNTER — Ambulatory Visit: Admitting: Internal Medicine

## 2024-07-28 VITALS — BP 152/78 | HR 75 | Ht 71.0 in | Wt 212.8 lb

## 2024-07-28 DIAGNOSIS — I10 Essential (primary) hypertension: Secondary | ICD-10-CM

## 2024-07-28 DIAGNOSIS — J310 Chronic rhinitis: Secondary | ICD-10-CM | POA: Diagnosis not present

## 2024-07-28 DIAGNOSIS — R0609 Other forms of dyspnea: Secondary | ICD-10-CM | POA: Insufficient documentation

## 2024-07-28 NOTE — Progress Notes (Signed)
 DIMITRIOS BALESTRIERI, male    DOB: 07-Dec-1955    MRN: [id number]   Brief patient profile:  66  yowm  quit smoking 2011 at wt 170  referred to pulmonary clinic in [place]  07/28/2024 by Dr Bluford  for doe    Pt not previously seen by [facility] service.    LDSCT 04/02/24 1. Lung-RADS 2S, benign appearance or behavior. Continue annual screening with low-dose chest CT without contrast in 12 months. 2. Moderate emphysema. 3. Extensive multi-vessel coronary artery calcification. 4. Fusiform dilation of the thoracic aorta measuring 4.1 cm in greatest diameter within its ascending segment and 3.3 cm in greatest diameter within its proximal descending segment. This appears stable since prior examination   History of Present Illness  07/28/2024  Pulmonary/ 1st office eval/ Inga Noller / [place] [facility]  at wt 213  Chief Complaint  Patient presents with   Establish Care    Referred for emphysema and former cigarette smoker. doe and mucus wont move   Dyspnea:  slt more difficulty toting/ loading bags  Cough: sporadic daytime clear mucus / assoc with raspy texture voice  / occ uses steroid nasal spray for congestion  Sleep: bed is flat/ one pillow feels he rests well no change in am  SABA use: none  02: none  LDSCT   q July    No obvious day to day or daytime pattern/variability or assoc mucus plugs or hemoptysis or cp or chest tightness, subjective wheeze or overt   hb symptoms.    Also denies any obvious fluctuation of symptoms with weather or environmental changes or other aggravating or alleviating factors except as outlined above   No unusual exposure hx or h/o childhood pna/ asthma or knowledge of premature birth.  Current Allergies, Complete Past Medical History, Past Surgical History, Family History, and Social History were reviewed in [employer] record.  ROS  The following are not active complaints unless bolded Hoarseness, sore throat, dysphagia, dental problems,  itching, sneezing,  nasal congestion or discharge of excess mucus or purulent secretions, ear ache,   fever, chills, sweats, unintended wt loss or wt gain, classically pleuritic or exertional cp,  orthopnea pnd or arm/hand swelling  or leg swelling, presyncope, palpitations, abdominal pain, anorexia, nausea, vomiting, diarrhea  or change in bowel habits or change in bladder habits, change in stools or change in urine, dysuria, hematuria,  rash, arthralgias, visual complaints, headache, numbness, weakness or ataxia or problems with walking or coordination,  change in mood or  memory.              Outpatient Medications Prior to Visit  Medication Sig Dispense Refill   escitalopram (LEXAPRO) 10 MG tablet Take 10 mg by mouth at bedtime.      levothyroxine (SYNTHROID) 125 MCG tablet TAKE ONE TABLET BY MOUTH DAILY. TAKE ONE-HALF TABLET ON SATURDAY AND SUNDAY. 90 tablet 1   nebivolol  (BYSTOLIC ) 5 MG tablet TAKE ONE TABLET (5MG  TOTAL) BY MOUTH EVERY MORNING 90 tablet 3   rosuvastatin  (CRESTOR ) 10 MG tablet Take 10 mg by mouth at bedtime.     promethazine -dextromethorphan (PROMETHAZINE -DM) 6.25-15 MG/5ML syrup Take 5 mLs by mouth 4 (four) times daily as needed. 118 mL 0   testosterone  cypionate (DEPOTESTOSTERONE CYPIONATE) 200 MG/ML injection Inject into the muscle every 14 (fourteen) days. Patient unsure of strength     No facility-administered medications prior to visit.    Past Medical History:  Diagnosis Date   Anxiety    Arthritis 2010  Bilateral renal artery stenosis    Cataract 2022   Emphysema of lung [facility]) 03 2011   History of tobacco abuse    Hyperlipidemia    Hypertension 2010   Hypothyroidism    Palpitations    Thyroid  disease 1990      Objective:     BP (!) 152/78   Pulse 75   Ht 5' 11 (1.803 m)   Wt 212 lb 12.8 oz (96.5 kg)   SpO2 97% Comment: ra  BMI 29.68 kg/m   SpO2: 97 % (ra)   Note systolic > 150 and pulse 75 p taking bystolic  5 mg pm prior to ov   amb slt  hoarse wm nad   HEENT : Oropharynx  M3-4 airway   Nasal turbinates mod severe TE R>L    NECK :  without  apparent JVD/ palpable Nodes/TM    LUNGS: no acc muscle use,  Min barrel  contour chest wall with bilateral  slightly decreased bs s audible wheeze and  without cough on insp or exp maneuvers and min  Hyperresonant  to  percussion bilaterally    CV:  RRR  no s3 or murmur or increase in P2, and no edema   ABD:  obese soft and nontender  with end insp hoover's   MS:  Nl gait/ ext warm without deformities Or obvious joint restrictions  calf tenderness, cyanosis or clubbing     SKIN: warm and dry without lesions    NEURO:  alert, approp, nl sensorium with  no motor or cerebellar deficits apparent.          Assessment   Assessment & Plan DOE (dyspnea on exertion) Quit smoking 2011 at wt around 170  - DOE  onset around  2023 - LDSCT  04/02/24  moderate emphysema  - 07/28/2024 @ wt 213   Walked on RA  x  3  lap(s) =  approx 450  ft  @ fast pace, stopped due to end of study  with lowest 02 sats 93%  - alpha one AT phenotype 07/28/2024 >>>     When respiratory symptoms begin or become refractory well after a patient reports complete smoking cessation,  Especially when this wasn't the case while they were smoking, a red flag is raised based on the work of Dr Genette which states:  if you quit smoking when your best day FEV1 is still well preserved it is highly unlikely you will progress to severe disease.  That is to say, once the smoking stops,  the symptoms should not suddenly erupt or markedly worsen.  If so, the differential diagnosis should include  obesity/deconditioning,  LPR/Reflux/Aspiration syndromes,  occult CHF, or  especially side effect of medications commonly used in this population.    He has gained 43 lbs since stopped smoking which places him at risk of LPR as well as increasing his ventilatory load but really not all that limited and not prone to aecopd so rec  1)   no bronchodilators for now  2) return for  PFTs w/in 3 m  3) refer back to [facility] with poorly controlled rinitis/ pnds/ upper airway cough but hold off on longterm LPR rx until evaluated  Rhinitis, chronic [facility] eval  remote, not in Epic - Allergy screen 07/28/2024 >  Eos 0. /  IgE  pending  - [facility] referred 07/28/2024 >>>   Essential hypertension Due to TA need Systolic much lower and best choice is BB if can tol the low HR/  advised   >>> bystolic  increase to 5 mg bid and f/u with Cards as planned   Each maintenance medication was reviewed in detail including emphasizing most importantly the difference between maintenance and prns and under what circumstances the prns are to be triggered using an action plan format where appropriate.  Total time for H and P, chart review, counseling,  directly observing portions of ambulatory 02 saturation study/ and generating customized AVS unique to this office visit / same day charting = 50 min with pt new to me                   AVS  Patient Instructions  Please remember to go to the lab department   for your tests - we will call you with the results when they are available.      My office will be contacting you by phone for referral to [facility] for referral to Dr Karis - if you don't hear back from my office within one week please call us  back or notify us  thru MyChart and we'll address it right away.   Increase your nebivolol  (bystolic ) to 5 mg twice daily and let Dr Ranee office know before you run out whether it's working better at this level to keep heart rate in 50-60 range and top BP less than 130      Please schedule a follow up visit in 3 months but call sooner if needed with PFTs on return   Ozell America, MD 07/28/2024

## 2024-07-28 NOTE — Assessment & Plan Note (Addendum)
 Due to TA need Systolic much lower and best choice is BB if can tol the low HR/ advised   >>> bystolic  increase to 5 mg bid and f/u with Cards as planned   Each maintenance medication was reviewed in detail including emphasizing most importantly the difference between maintenance and prns and under what circumstances the prns are to be triggered using an action plan format where appropriate.  Total time for H and P, chart review, counseling,  directly observing portions of ambulatory 02 saturation study/ and generating customized AVS unique to this office visit / same day charting = 50 min with pt new to me

## 2024-07-28 NOTE — Patient Instructions (Addendum)
 Please remember to go to the lab department   for your tests - we will call you with the results when they are available.      My office will be contacting you by phone for referral to [facility] for referral to Dr Karis - if you don't hear back from my office within one week please call us  back or notify us  thru MyChart and we'll address it right away.   Increase your nebivolol  (bystolic ) to 5 mg twice daily and let Dr Ranee office know before you run out whether it's working better at this level to keep heart rate in 50-60 range and top BP less than 130      Please schedule a follow up visit in 3 months but call sooner if needed with PFTs on return

## 2024-07-28 NOTE — Assessment & Plan Note (Addendum)
 Teoh eval  remote, not in Epic - Allergy screen 07/28/2024 >  Eos 0. /  IgE  pending  - [facility] referred 07/28/2024 >>>

## 2024-07-28 NOTE — Assessment & Plan Note (Addendum)
 Quit smoking 2011 at wt around 170  - DOE  onset around  2023 - LDSCT  04/02/24  moderate emphysema  - 07/28/2024 @ wt 213   Walked on RA  x  3  lap(s) =  approx 450  ft  @ fast pace, stopped due to end of study  with lowest 02 sats 93%  - alpha one AT phenotype 07/28/2024 >>>     When respiratory symptoms begin or become refractory well after a patient reports complete smoking cessation,  Especially when this wasn't the case while they were smoking, a red flag is raised based on the work of Dr Genette which states:  if you quit smoking when your best day FEV1 is still well preserved it is highly unlikely you will progress to severe disease.  That is to say, once the smoking stops,  the symptoms should not suddenly erupt or markedly worsen.  If so, the differential diagnosis should include  obesity/deconditioning,  LPR/Reflux/Aspiration syndromes,  occult CHF, or  especially side effect of medications commonly used in this population.    He has gained 43 lbs since stopped smoking which places him at risk of LPR as well as increasing his ventilatory load but really not all that limited and not prone to aecopd so rec  1)  no bronchodilators for now  2) return for  PFTs w/in 3 m  3) refer back to [facility] with poorly controlled rinitis/ pnds/ upper airway cough but hold off on longterm LPR rx until evaluated

## 2024-07-28 NOTE — Telephone Encounter (Signed)
 Spoke with patient regarding the Thursday 10/22/2024 8:30 am PFT appointment at [facility] time is 8:15 am --1st floor registration desk for check in---follow up with Dr. Darlean is 10/22/24 at 9:45am.  Will mail information to patient and he voiced his understanding

## 2024-07-31 ENCOUNTER — Ambulatory Visit: Payer: Self-pay | Admitting: Internal Medicine

## 2024-07-31 LAB — CBC WITH DIFFERENTIAL/PLATELET
Basophils Absolute: 0 x10E3/uL (ref 0.0–0.2)
Basos: 1 %
EOS (ABSOLUTE): 0.1 x10E3/uL (ref 0.0–0.4)
Eos: 1 %
Hematocrit: 39.9 % (ref 37.5–51.0)
Hemoglobin: 13.6 g/dL (ref 13.0–17.7)
Immature Grans (Abs): 0 x10E3/uL (ref 0.0–0.1)
Immature Granulocytes: 0 %
Lymphocytes Absolute: 1 x10E3/uL (ref 0.7–3.1)
Lymphs: 17 %
MCH: 32.2 pg (ref 26.6–33.0)
MCHC: 34.1 g/dL (ref 31.5–35.7)
MCV: 95 fL (ref 79–97)
Monocytes Absolute: 0.8 x10E3/uL (ref 0.1–0.9)
Monocytes: 13 %
Neutrophils Absolute: 4 x10E3/uL (ref 1.4–7.0)
Neutrophils: 68 %
Platelets: 226 x10E3/uL (ref 150–450)
RBC: 4.22 x10E6/uL (ref 4.14–5.80)
RDW: 12 % (ref 11.6–15.4)
WBC: 5.9 x10E3/uL (ref 3.4–10.8)

## 2024-07-31 LAB — IGE: IgE (Immunoglobulin E), Serum: 93 [IU]/mL (ref 6–495)

## 2024-07-31 LAB — ALPHA-1-ANTITRYPSIN PHENOTYP: A-1 Antitrypsin: 133 mg/dL (ref 101–187)

## 2024-08-07 ENCOUNTER — Telehealth: Payer: Self-pay

## 2024-08-07 NOTE — Telephone Encounter (Signed)
 Copied from CRM [id number]. Topic: Clinical - Prescription Issue >> Aug 05, 2024 11:49 AM Benton KIDD wrote: Reason for CRM: [facility] this patient say he saw dr wert and he doubled the dose nebivolol  5 mg said dr said to take 2 a day and i need dr to send a new prescription with the double dose .   At [facility] Dr Darlean states:  Increase your nebivolol  (bystolic ) to 5 mg twice daily and let Dr Ranee office know before you run out whether it's working better at this level to keep heart rate in 50-60 range and top BP less than 130.  Dr Court would be the one to increase dose or if not working then to leave where it is

## 2024-09-04 ENCOUNTER — Ambulatory Visit

## 2024-09-04 VITALS — BP 130/70 | Ht 71.0 in | Wt 210.0 lb

## 2024-09-04 DIAGNOSIS — Z0000 Encounter for general adult medical examination without abnormal findings: Secondary | ICD-10-CM

## 2024-09-04 NOTE — Patient Instructions (Addendum)
 Mr. Rylee,  Thank you for taking the time for your Medicare Wellness Visit. I appreciate your continued commitment to your health goals. Please review the care plan we discussed, and feel free to reach out if I can assist you further.  Please note that Annual Wellness Visits do not include a physical exam. Some assessments may be limited, especially if the visit was conducted virtually. If needed, we may recommend an in-person follow-up with your provider.  Ongoing Care Seeing your primary care provider every 3 to 6 months helps us  monitor your health and provide consistent, personalized care.   1 year follow up for Medicare well visit: Thursday September 16, 2025 at 9:20 am with medicare wellness nurse in office  Referrals If a referral was made during today's visit and you haven't received any updates within two weeks, please contact the referred provider directly to check on the status.  Urology Referral: [facility] [address] [place] [address]  [place] [address] Phone: [phone]  Someone from Dr. Chari office is following up on your ENT referral and will be in touch     Recommended Screenings:  Health Maintenance  Topic Date Due   Hepatitis C Screening  Never done   DTaP/Tdap/Td vaccine (1 - Tdap) Never done   Pneumococcal Vaccine for age over 74 (1 of 2 - PCV) Never done   Zoster (Shingles) Vaccine (1 of 2) Never done   Flu Shot  04/17/2024   Medicare Annual Wellness Visit  06/26/2024   Screening for Lung Cancer  04/02/2025   Colon Cancer Screening  08/29/2026   Meningitis B Vaccine  Aged Out   COVID-19 Vaccine  Discontinued       08/31/2024    9:42 AM  Advanced Directives  Does Patient Have a Medical Advance Directive? No  Would patient like information on creating a medical advance directive? Yes (MAU/Ambulatory/Procedural Areas - Information given)    Vision: Annual vision screenings are recommended for early detection of glaucoma, cataracts, and  diabetic retinopathy. These exams can also reveal signs of chronic conditions such as diabetes and high blood pressure.  Dental: Annual dental screenings help detect early signs of oral cancer, gum disease, and other conditions linked to overall health, including heart disease and diabetes.  Please see the attached documents for additional preventive care recommendations.

## 2024-09-04 NOTE — Progress Notes (Signed)
 "  HM Addressed: Urology referral information provided on patients AVS. Kay with Dr. Chari office is following up on ENT referral and will contact patient with update  Chief Complaint  Patient presents with   Medicare Wellness     Subjective:   CLARICE BONAVENTURE is a 68 y.o. male who presents for a Medicare Annual Wellness Visit.  Visit info / Clinical Intake: Medicare Wellness Visit Type:: Subsequent Annual Wellness Visit Persons participating in visit and providing information:: patient Medicare Wellness Visit Mode:: Telephone If telephone:: video declined Since this visit was completed virtually, some vitals may be partially provided or unavailable. Missing vitals are due to the limitations of the virtual format.: Documented vitals are patient reported If Telephone or Video please confirm:: I connected with patient using audio/video enable telemedicine. I verified patient identity with two identifiers, discussed telehealth limitations, and patient agreed to proceed. Patient Location:: home Provider Location:: office Interpreter Needed?: No Pre-visit prep was completed: yes AWV questionnaire completed by patient prior to visit?: yes Date:: 08/31/24 Living arrangements:: (Patient-Rptd) lives with spouse/significant other Patient's Overall Health Status Rating: (Patient-Rptd) good Typical amount of pain: (Patient-Rptd) some Does pain affect daily life?: (Patient-Rptd) no Are you currently prescribed opioids?: no  Dietary Habits and Nutritional Risks How many meals a day?: (Patient-Rptd) 3 Eats fruit and vegetables daily?: (Patient-Rptd) yes Most meals are obtained by: (Patient-Rptd) eating out; having others provide food In the last 2 weeks, have you had any of the following?: none Diabetic:: no  Functional Status Activities of Daily Living (to include ambulation/medication): (Patient-Rptd) Independent Ambulation: (Patient-Rptd) Independent Medication Administration:  (Patient-Rptd) Independent Home Management (perform basic housework or laundry): (Patient-Rptd) Independent Manage your own finances?: (Patient-Rptd) yes Primary transportation is: (Patient-Rptd) driving Concerns about vision?: no *vision screening is required for WTM* Concerns about hearing?: no  Fall Screening Falls in the past year?: (Patient-Rptd) 0 Number of falls in past year: 0 Was there an injury with Fall?: 0 Fall Risk Category Calculator: 0 Patient Fall Risk Level: Low Fall Risk  Fall Risk Patient at Risk for Falls Due to: No Fall Risks Fall risk Follow up: Falls evaluation completed; Education provided; Falls prevention discussed  Home and Transportation Safety: All rugs have non-skid backing?: (Patient-Rptd) yes All stairs or steps have railings?: (!) (Patient-Rptd) no Grab bars in the bathtub or shower?: (!) (Patient-Rptd) no Have non-skid surface in bathtub or shower?: (Patient-Rptd) yes Good home lighting?: (Patient-Rptd) yes Regular seat belt use?: (Patient-Rptd) yes Hospital stays in the last year:: (Patient-Rptd) no  Cognitive Assessment Difficulty concentrating, remembering, or making decisions? : (Patient-Rptd) no Will 6CIT or Mini Cog be Completed: yes What year is it?: 0 points What month is it?: 0 points Give patient an address phrase to remember (5 components): [address] [place] About what time is it?: 0 points Count backwards from 20 to 1: 0 points Say the months of the year in reverse: 0 points Repeat the address phrase from earlier: 0 points 6 CIT Score: 0 points  Advance Directives (For Healthcare) Does Patient Have a Medical Advance Directive?: No Would patient like information on creating a medical advance directive?: Yes (MAU/Ambulatory/Procedural Areas - Information given)  Reviewed/Updated  Reviewed/Updated: Reviewed All (Medical, Surgical, Family, Medications, Allergies, Care Teams, Patient Goals)    Allergies  (verified) Patient has no known allergies.   Current Medications (verified) Outpatient Encounter Medications as of 09/04/2024  Medication Sig   escitalopram (LEXAPRO) 10 MG tablet Take 10 mg by mouth at bedtime.  levothyroxine (SYNTHROID) 125 MCG tablet TAKE ONE TABLET BY MOUTH DAILY. TAKE ONE-HALF TABLET ON SATURDAY AND SUNDAY.   nebivolol  (BYSTOLIC ) 5 MG tablet TAKE ONE TABLET (5MG  TOTAL) BY MOUTH EVERY MORNING   rosuvastatin  (CRESTOR ) 10 MG tablet Take 10 mg by mouth at bedtime.   [DISCONTINUED] cetirizine  (ZYRTEC  ALLERGY) 10 MG tablet Take 1 tablet (10 mg total) by mouth daily.   [DISCONTINUED] fluticasone  (FLONASE ) 50 MCG/ACT nasal spray Place 1 spray into both nostrils daily for 14 days.   No facility-administered encounter medications on file as of 09/04/2024.    History: Past Medical History:  Diagnosis Date   Anxiety    Arthritis 2010   Bilateral renal artery stenosis    Cataract 2022   Emphysema of lung (HCC) 03 2011   History of tobacco abuse    Hyperlipidemia    Hypertension 2010   Hypothyroidism    Palpitations    Thyroid  disease 1990   Past Surgical History:  Procedure Laterality Date   BIOPSY  08/29/2021   Procedure: BIOPSY;  Surgeon: Eartha Angelia Sieving, MD;  [facility];  Service: Gastroenterology;;   COLONOSCOPY N/A 07/11/2016   Procedure: COLONOSCOPY;  Surgeon: Claudis RAYMOND Rivet, MD;  [facility];  Service: Endoscopy;  Laterality: N/A;  930   COLONOSCOPY WITH PROPOFOL  N/A 08/29/2021   Procedure: COLONOSCOPY WITH PROPOFOL ;  Surgeon: Eartha Angelia Sieving, MD;  [facility];  Service: Gastroenterology;  Laterality: N/A;  1200   EYE SURGERY  2022   MENISCUS REPAIR Left 05/29/2013   POLYPECTOMY  07/11/2016   Procedure: POLYPECTOMY;  Surgeon: Claudis RAYMOND Rivet, MD;  [facility];  Service: Endoscopy;;  colon   POLYPECTOMY  08/29/2021   Procedure: POLYPECTOMY;  Surgeon: Eartha Angelia Sieving, MD;   [facility];  Service: Gastroenterology;;   US  ECHOCARDIOGRAPHY  07/18/2012   Family History  Problem Relation Age of Onset   Hyperlipidemia Father    Hypertension Father    Stroke Father    CAD Maternal Grandmother    Heart attack Maternal Grandfather    Cancer Mother    Diabetes Mother    Hypertension Mother    Social History   Occupational History   Not on file  Tobacco Use   Smoking status: Former    Current packs/day: 0.00    Average packs/day: 2.0 packs/day for 40.0 years (80.0 ttl pk-yrs)    Types: Cigarettes    Start date: 12/16/1970    Quit date: 12/16/2010    Years since quitting: 13.7   Smokeless tobacco: Current    Types: Snuff   Tobacco comments:    i use snus  Vaping Use   Vaping status: Never Used  Substance and Sexual Activity   Alcohol use: Yes    Alcohol/week: 14.0 standard drinks of alcohol    Types: 14 Cans of beer per week    Comment: beer 2 -5 per day- none for last few weeks as of 08/24/2021   Drug use: Never   Sexual activity: Not Currently    Birth control/protection: None   Tobacco Counseling Ready to quit: Yes Counseling given: Yes Tobacco comments: i use snus  SDOH Screenings   Food Insecurity: No Food Insecurity (08/31/2024)  Housing: Low Risk (08/31/2024)  Transportation Needs: No Transportation Needs (08/31/2024)  Utilities: Not At Risk (09/04/2024)  Alcohol Screen: Low Risk (08/31/2024)  Depression (PHQ2-9): Low Risk (09/04/2024)  Financial Resource Strain: Low Risk (08/31/2024)  Physical Activity: Inactive (08/31/2024)  Social  Connections: Moderately Integrated (08/31/2024)  Stress: No Stress Concern Present (08/31/2024)  Tobacco Use: High Risk (09/04/2024)  Health Literacy: Adequate Health Literacy (09/04/2024)   See flowsheets for full screening details  Depression Screen PHQ 2 & 9 Depression Scale- Over the past 2 weeks, how often have you been bothered by any of the following problems? Little interest or  pleasure in doing things: 0 Feeling down, depressed, or hopeless (PHQ Adolescent also includes...irritable): 0 PHQ-2 Total Score: 0 Trouble falling or staying asleep, or sleeping too much: 0 Feeling tired or having little energy: 0 Poor appetite or overeating (PHQ Adolescent also includes...weight loss): 0 Feeling bad about yourself - or that you are a failure or have let yourself or your family down: 0 Trouble concentrating on things, such as reading the newspaper or watching television (PHQ Adolescent also includes...like school work): 0 Moving or speaking so slowly that other people could have noticed. Or the opposite - being so fidgety or restless that you have been moving around a lot more than usual: 0 Thoughts that you would be better off dead, or of hurting yourself in some way: 0 PHQ-9 Total Score: 0 If you checked off any problems, how difficult have these problems made it for you to do your work, take care of things at home, or get along with other people?: Not difficult at all  Depression Treatment Depression Interventions/Treatment : EYV7-0 Score <4 Follow-up Not Indicated     Goals Addressed               This Visit's Progress     I want to exercise more (pt-stated)               Objective:    Today's Vitals   09/04/24 1043  BP: 130/70  Weight: 210 lb (95.3 kg)  Height: 5' 11 (1.803 m)   Body mass index is 29.29 kg/m.  Hearing/Vision screen Hearing Screening - Comments:: Patient denies any hearing difficulties.   Vision Screening - Comments:: Wears rx glasses - up to date with routine eye exams with  Dr. Norleen Hamilton in [place] Immunizations and Health Maintenance Health Maintenance  Topic Date Due   Hepatitis C Screening  Never done   DTaP/Tdap/Td (1 - Tdap) Never done   Pneumococcal Vaccine: 50+ Years (1 of 2 - PCV) Never done   Zoster Vaccines- Shingrix (1 of 2) Never done   Influenza Vaccine  04/17/2024   Medicare Annual Wellness (AWV)   06/26/2024   Lung Cancer Screening  04/02/2025   Colonoscopy  08/29/2026   Meningococcal B Vaccine  Aged Out   COVID-19 Vaccine  Discontinued        Assessment/Plan:  This is a routine wellness examination for [place].  Patient Care Team: Cook, Jayce G, DO as PCP - General (Family Medicine) Darlean Ozell NOVAK, MD as Consulting Physician (Pulmonary Disease) Court Dorn PARAS, MD as Consulting Physician (Cardiology) Hamilton Simmonds, OD as Referring Physician (Optometry)  I have personally reviewed and noted the following in the patients chart:   Medical and social history Use of alcohol, tobacco or illicit drugs  Current medications and supplements including opioid prescriptions. Functional ability and status Nutritional status Physical activity Advanced directives List of other physicians Hospitalizations, surgeries, and ER visits in previous 12 months Vitals Screenings to include cognitive, depression, and falls Referrals and appointments  No orders of the defined types were placed in this encounter.  In addition, I have reviewed and discussed with patient certain preventive protocols,  quality metrics, and best practice recommendations. A written personalized care plan for preventive services as well as general preventive health recommendations were provided to patient.   Reyann Troop, CMA   09/04/2024   Return on Thursday September 16, 2025 at 9:20 am, for your yearly Medicare Wellness Visit in person.  After Visit Summary: (MyChart) Due to this being a telephonic visit, the after visit summary with patients personalized plan was offered to patient via MyChart    "

## 2024-09-14 ENCOUNTER — Ambulatory Visit: Admitting: Family Medicine

## 2024-10-22 ENCOUNTER — Ambulatory Visit: Admitting: Internal Medicine

## 2024-10-22 ENCOUNTER — Encounter: Payer: Self-pay | Admitting: Internal Medicine

## 2024-10-22 ENCOUNTER — Ambulatory Visit (HOSPITAL_COMMUNITY)
Admission: RE | Admit: 2024-10-22 | Discharge: 2024-10-22 | Disposition: A | Source: Ambulatory Visit | Attending: Internal Medicine | Admitting: Internal Medicine

## 2024-10-22 VITALS — BP 141/78 | HR 78 | Wt 213.0 lb

## 2024-10-22 DIAGNOSIS — R0609 Other forms of dyspnea: Secondary | ICD-10-CM

## 2024-10-22 DIAGNOSIS — J310 Chronic rhinitis: Secondary | ICD-10-CM

## 2024-10-22 DIAGNOSIS — I10 Essential (primary) hypertension: Secondary | ICD-10-CM

## 2024-10-22 LAB — PULMONARY FUNCTION TEST
DL/VA % pred: 89 %
DL/VA: 3.56 ml/min/mmHg/L
DLCO unc % pred: 77 %
DLCO unc: 20.48 ml/min/mmHg
FEF 25-75 Post: 0.93 L/s
FEF 25-75 Pre: 0.58 L/s
FEF2575-%Change-Post: 61 %
FEF2575-%Pred-Post: 36 %
FEF2575-%Pred-Pre: 22 %
FEV1-%Change-Post: 17 %
FEV1-%Pred-Post: 56 %
FEV1-%Pred-Pre: 47 %
FEV1-Post: 1.9 L
FEV1-Pre: 1.62 L
FEV1FVC-%Change-Post: 5 %
FEV1FVC-%Pred-Pre: 70 %
FEV6-%Change-Post: 6 %
FEV6-%Pred-Post: 69 %
FEV6-%Pred-Pre: 65 %
FEV6-Post: 3.01 L
FEV6-Pre: 2.83 L
FEV6FVC-%Change-Post: -4 %
FEV6FVC-%Pred-Post: 91 %
FEV6FVC-%Pred-Pre: 95 %
FVC-%Change-Post: 11 %
FVC-%Pred-Post: 75 %
FVC-%Pred-Pre: 68 %
FVC-Post: 3.49 L
FVC-Pre: 3.13 L
Post FEV1/FVC ratio: 54 %
Post FEV6/FVC ratio: 86 %
Pre FEV1/FVC ratio: 52 %
Pre FEV6/FVC Ratio: 91 %
RV % pred: 254 %
RV: 6.38 L
TLC % pred: 134 %
TLC: 9.7 L

## 2024-10-22 MED ORDER — ALBUTEROL SULFATE (2.5 MG/3ML) 0.083% IN NEBU
2.5000 mg | INHALATION_SOLUTION | Freq: Once | RESPIRATORY_TRACT | Status: AC
  Administered 2024-10-22: 2.5 mg via RESPIRATORY_TRACT

## 2024-10-22 NOTE — Patient Instructions (Addendum)
 Try prilosec otc 20mg   Take 30-60 min before first meal of the day and Pepcid  ac (famotidine ) 20 mg one after supper   for at least 6 weeks to see if throat clearing continues.  GERD (REFLUX)  is an extremely common cause of respiratory symptoms just like yours , many times with no obvious heartburn at all.    It can be treated with medication, but also with lifestyle changes including elevation of the head of your bed (ideally with 6 -8inch blocks under the headboard of your bed),  Smoking cessation, avoidance of late meals, excessive alcohol, and avoid fatty foods, chocolate, peppermint, colas, red wine, and acidic juices such as orange juice.  NO MINT OR MENTHOL PRODUCTS SO NO COUGH DROPS (LUDENs is ok)  USE SUGARLESS CANDY INSTEAD (Jolley ranchers or Stover's or Life Savers) or even ice chips will also do - the key is to swallow to prevent all throat clearing. NO OIL BASED VITAMINS - use powdered substitutes.  Avoid fish oil when coughing.    Mandibular advancement devices for sleep apnea may be enough to knock out the snoring.  If your breathing is getting is worse please call for follow up - we can see you as needed

## 2024-10-22 NOTE — Progress Notes (Unsigned)
 "   Daniel [name], male    DOB: [birth year]    MRN: [id number]   Brief patient profile:  22  yowm  quit smoking 2011 at wt 170  referred to pulmonary clinic in [facility]  07/28/2024 by Dr Daniel [name]  for doe       LDSCT 04/02/24 1. Lung-RADS 2S, benign appearance or behavior. Continue annual screening with low-dose chest CT without contrast in 12 months. 2. Moderate emphysema. 3. Extensive multi-vessel coronary artery calcification. 4. Fusiform dilation of the thoracic aorta measuring 4.1 cm in greatest diameter within its ascending segment and 3.3 cm in greatest diameter within its proximal descending segment. This appears stable since prior examination   History of Present Illness  07/28/2024  Pulmonary/ 1st [name] eval/ Daniel [name] / [place] [name]  at wt 213  Chief Complaint  Patient presents with   Establish Care    Referred for emphysema and former cigarette smoker. doe and mucus wont move   Dyspnea:  slt more difficulty toting/ loading bags  Cough: sporadic daytime clear mucus / assoc with raspy texture voice  / occ uses steroid nasal spray for congestion  Sleep: bed is flat/ one pillow feels he rests well no change in am  SABA use: none  02: none  LDSCT   q July   Patient Instructions   Increase your nebivolol  (bystolic ) to 5 mg twice daily and let Dr Daniel [name] know before you run out whether it's working better at this level to keep heart rate in 50-60 range and top BP less than 130  - Allergy screen 07/28/2024 >  Eos 0.1 /  IgE  93  - Daniel [name] referred 07/28/2024 >>>  Daniel [name]  rec sinus CT but declined by insurance  Please schedule a follow up visit in 3 months but call sooner if needed with PFTs on return   10/22/2024  f/u ov/Daniel [name]/Daniel [name] re: doe/ rhinitis maint on just astelin/flonase  per ENT but still excessive daytime throat clearing s mucus production   Chief Complaint  Patient presents with   Shortness of Breath    Pft complete Doe up hill / mucus doesn't  seem to want to move   Dyspnea:  not limited on flat surface, just hills and steps and not interested in meds for doe  Cough: usually p breakfast / coffee non producive  Sleeping: flat bed one pillow no noct  resp cc  SABA use: none  02: none   Lung cancer screening: due q July   No obvious day to day or daytime variability or assoc excess/ purulent sputum or mucus plugs or hemoptysis or cp or chest tightness, subjective wheeze or overt sinus or hb symptoms.    Also denies any obvious fluctuation of symptoms with weather or environmental changes or other aggravating or alleviating factors except as outlined above   No unusual exposure hx or h/o childhood pna/ asthma or knowledge of premature birth.  Current Allergies, Complete Past Medical History, Past Surgical History, Family History, and Social History were reviewed in [employer] record.  ROS  The following are not active complaints unless bolded Hoarseness, sore throat, dysphagia, dental problems, itching, sneezing,  nasal congestion or discharge of excess mucus or purulent secretions, ear ache,   fever, chills, sweats, unintended wt loss or wt gain, classically pleuritic or exertional cp,  orthopnea pnd or arm/hand swelling  or leg swelling, presyncope, palpitations, abdominal pain, anorexia, nausea, vomiting, diarrhea  or change in bowel habits or change in  bladder habits, change in stools or change in urine, dysuria, hematuria,  rash, arthralgias, visual complaints, headache, numbness, weakness or ataxia or problems with walking or coordination,  change in mood or  memory. Wife says snoring too much         Outpatient Medications Prior to Visit  Medication Sig Dispense Refill   escitalopram (LEXAPRO) 10 MG tablet Take 10 mg by mouth at bedtime.      levothyroxine (SYNTHROID) 125 MCG tablet TAKE ONE TABLET BY MOUTH DAILY. TAKE ONE-HALF TABLET ON SATURDAY AND SUNDAY. 90 tablet 1   nebivolol  (BYSTOLIC ) 5 MG  tablet TAKE ONE TABLET (5MG  TOTAL) BY MOUTH EVERY MORNING 90 tablet 3   rosuvastatin  (CRESTOR ) 10 MG tablet Take 10 mg by mouth at bedtime.     No facility-administered medications prior to visit.       Past Medical History:  Diagnosis Date   Anxiety    Arthritis 2010   Bilateral renal artery stenosis    Cataract 2022   Emphysema of lung [facility]) 03 2011   History of tobacco abuse    Hyperlipidemia    Hypertension 2010   Hypothyroidism    Palpitations    Thyroid  disease 1990      Objective:     Wt Readings from Last 3 Encounters:  10/22/24 213 lb (96.6 kg)  09/04/24 210 lb (95.3 kg)  07/28/24 212 lb 12.8 oz (96.5 kg)      Vital signs reviewed  10/22/2024  - Note at rest 02 sats  96% on RAf   General appearance:    amb pleasant wm nad/ freq dry throat clearing      HEENT : Oropharynx  M3-4 airway   Nasal turbinates mod severe TE R>L     Min barr ***        Assessment            "

## 2025-09-16 ENCOUNTER — Ambulatory Visit
